# Patient Record
Sex: Female | Born: 1995 | Race: Black or African American | Hispanic: No | Marital: Single | Smoking: Current every day smoker
Health system: Southern US, Community
[De-identification: ages and names within clinical notes are randomized; demographics above are authoritative.]

## PROBLEM LIST (undated history)

## (undated) ENCOUNTER — Inpatient Hospital Stay (HOSPITAL_COMMUNITY): Payer: Self-pay

## (undated) DIAGNOSIS — Z789 Other specified health status: Secondary | ICD-10-CM

## (undated) HISTORY — PX: NO PAST SURGERIES: SHX2092

---

## 2013-09-21 ENCOUNTER — Emergency Department (HOSPITAL_COMMUNITY)
Admission: EM | Admit: 2013-09-21 | Discharge: 2013-09-21 | Disposition: A | Discharge status: Home / Self Care | Payer: Medicaid Other | Attending: Emergency Medicine | Admitting: Emergency Medicine

## 2013-09-21 ENCOUNTER — Encounter (HOSPITAL_COMMUNITY): Payer: Self-pay | Admitting: Emergency Medicine

## 2013-09-21 DIAGNOSIS — N898 Other specified noninflammatory disorders of vagina: Secondary | ICD-10-CM | POA: Insufficient documentation

## 2013-09-21 DIAGNOSIS — N939 Abnormal uterine and vaginal bleeding, unspecified: Secondary | ICD-10-CM

## 2013-09-21 DIAGNOSIS — Z3202 Encounter for pregnancy test, result negative: Secondary | ICD-10-CM | POA: Insufficient documentation

## 2013-09-21 DIAGNOSIS — Z880 Allergy status to penicillin: Secondary | ICD-10-CM | POA: Insufficient documentation

## 2013-09-21 DIAGNOSIS — N390 Urinary tract infection, site not specified: Secondary | ICD-10-CM | POA: Insufficient documentation

## 2013-09-21 LAB — HIV ANTIBODY (ROUTINE TESTING W REFLEX): HIV 1&2 Ab, 4th Generation: NONREACTIVE

## 2013-09-21 LAB — WET PREP, GENITAL
Clue Cells Wet Prep HPF POC: NONE SEEN
Trich, Wet Prep: NONE SEEN
Yeast Wet Prep HPF POC: NONE SEEN

## 2013-09-21 LAB — URINE MICROSCOPIC-ADD ON

## 2013-09-21 LAB — URINALYSIS, ROUTINE W REFLEX MICROSCOPIC
Bilirubin Urine: NEGATIVE
Glucose, UA: NEGATIVE mg/dL
Ketones, ur: NEGATIVE mg/dL
Nitrite: NEGATIVE
Protein, ur: 30 mg/dL — AB
Specific Gravity, Urine: 1.021 (ref 1.005–1.030)
Urobilinogen, UA: 1 mg/dL (ref 0.0–1.0)
pH: 7 (ref 5.0–8.0)

## 2013-09-21 LAB — POC URINE PREG, ED: Preg Test, Ur: NEGATIVE

## 2013-09-21 MED ORDER — CEPHALEXIN 500 MG PO CAPS: ORAL_CAPSULE | ORAL | Status: DC

## 2013-09-21 NOTE — ED Notes (Signed)
Pelvic cart at bedside. 

## 2013-09-21 NOTE — Discharge Instructions (Signed)
Urinary Tract Infection Urinary tract infections (UTIs) can develop anywhere along your urinary tract. Your urinary tract is your body's drainage system for removing wastes and extra water. Your urinary tract includes two kidneys, two ureters, a bladder, and a urethra. Your kidneys are a pair of bean-shaped organs. Each kidney is about the size of your fist. They are located below your ribs, one on each side of your spine. CAUSES Infections are caused by microbes, which are microscopic organisms, including fungi, viruses, and bacteria. These organisms are so small that they can only be seen through a microscope. Bacteria are the microbes that most commonly cause UTIs. SYMPTOMS  Symptoms of UTIs may vary by age and gender of the patient and by the location of the infection. Symptoms in young women typically include a frequent and intense urge to urinate and a painful, burning feeling in the bladder or urethra during urination. Older women and men are more likely to be tired, shaky, and weak and have muscle aches and abdominal pain. A fever may mean the infection is in your kidneys. Other symptoms of a kidney infection include pain in your back or sides below the ribs, nausea, and vomiting. DIAGNOSIS To diagnose a UTI, your caregiver will ask you about your symptoms. Your caregiver also will ask to provide a urine sample. The urine sample will be tested for bacteria and white blood cells. White blood cells are made by your body to help fight infection. TREATMENT  Typically, UTIs can be treated with medication. Because most UTIs are caused by a bacterial infection, they usually can be treated with the use of antibiotics. The choice of antibiotic and length of treatment depend on your symptoms and the type of bacteria causing your infection. HOME CARE INSTRUCTIONS  If you were prescribed antibiotics, take them exactly as your caregiver instructs you. Finish the medication even if you feel better after you  have only taken some of the medication.  Drink enough water and fluids to keep your urine clear or pale yellow.  Avoid caffeine, tea, and carbonated beverages. They tend to irritate your bladder.  Empty your bladder often. Avoid holding urine for long periods of time.  Empty your bladder before and after sexual intercourse.  After a bowel movement, women should cleanse from front to back. Use each tissue only once. SEEK MEDICAL CARE IF:   You have back pain.  You develop a fever.  Your symptoms do not begin to resolve within 3 days. SEEK IMMEDIATE MEDICAL CARE IF:   You have severe back pain or lower abdominal pain.  You develop chills.  You have nausea or vomiting.  You have continued burning or discomfort with urination. MAKE SURE YOU:   Understand these instructions.  Will watch your condition.  Will get help right away if you are not doing well or get worse. Document Released: 02/21/2005 Document Revised: 11/13/2011 Document Reviewed: 06/22/2011 [facility]Care Patient Information 2014 [place], [place].  Dysfunctional Uterine Bleeding Normally, menstrual periods begin between ages 3211 to 1617 in young women. A normal menstrual cycle/period may begin every 23 days up to 35 days and lasts from 1 to 7 days. Around 12 to 14 days before your menstrual period starts, ovulation (ovary produces an egg) occurs. When counting the time between menstrual periods, count from the first day of bleeding of the previous period to the first day of bleeding of the next period. Dysfunctional (abnormal) uterine bleeding is bleeding that is different from a normal menstrual period. Your periods  may come earlier or later than usual. They may be lighter, have blood clots or be heavier. You may have bleeding between periods, or you may skip one period or more. You may have bleeding after sexual intercourse, bleeding after menopause, or no menstrual period. CAUSES   Pregnancy (normal, miscarriage,  tubal).  IUDs (intrauterine device, birth control).  Birth control pills.  Hormone treatment.  Menopause.  Infection of the cervix.  Blood clotting problems.  Infection of the inside lining of the uterus.  Endometriosis, inside lining of the uterus growing in the pelvis and other female organs.  Adhesions (scar tissue) inside the uterus.  Obesity or severe weight loss.  Uterine polyps inside the uterus.  Cancer of the vagina, cervix, or uterus.  Ovarian cysts or polycystic ovary syndrome.  Medical problems (diabetes, thyroid disease).  Uterine fibroids (noncancerous tumor).  Problems with your female hormones.  Endometrial hyperplasia, very thick lining and enlarged cells inside of the uterus.  Medicines that interfere with ovulation.  Radiation to the pelvis or abdomen.  Chemotherapy. DIAGNOSIS   Your doctor will discuss the history of your menstrual periods, medicines you are taking, changes in your weight, stress in your life, and any medical problems you may have.  Your doctor will do a physical and pelvic examination.  Your doctor may want to perform certain tests to make a diagnosis, such as:  Pap test.  Blood tests.  Cultures for infection.  CT scan.  Ultrasound.  Hysteroscopy.  Laparoscopy.  MRI.  Hysterosalpingography.  D and C.  Endometrial biopsy. TREATMENT  Treatment will depend on the cause of the dysfunctional uterine bleeding (DUB). Treatment may include:  Observing your menstrual periods for a couple of months.  Prescribing medicines for medical problems, including:  Antibiotics.  Hormones.  Birth control pills.  Removing an IUD (intrauterine device, birth control).  Surgery:  D and C (scrape and remove tissue from inside the uterus).  Laparoscopy (examine inside the abdomen with a lighted tube).  Uterine ablation (destroy lining of the uterus with electrical current, laser, heat, or freezing).  Hysteroscopy  (examine cervix and uterus with a lighted tube).  Hysterectomy (remove the uterus). HOME CARE INSTRUCTIONS   If medicines were prescribed, take exactly as directed. Do not change or switch medicines without consulting your caregiver.  Long term heavy bleeding may result in iron deficiency. Your caregiver may have prescribed iron pills. They help replace the iron that your body lost from heavy bleeding. Take exactly as directed.  Do not take aspirin or medicines that contain aspirin one week before or during your menstrual period. Aspirin may make the bleeding worse.  If you need to change your sanitary pad or tampon more than once every 2 hours, stay in bed with your feet elevated and a cold pack on your lower abdomen. Rest as much as possible, until the bleeding stops or slows down.  Eat well-balanced meals. Eat foods high in iron. Examples are:  Leafy green vegetables.  Whole-grain breads and cereals.  Eggs.  Meat.  Liver.  Do not try to lose weight until the abnormal bleeding has stopped and your blood iron level is back to normal. Do not lift more than ten pounds or do strenuous activities when you are bleeding.  For a couple of months, make note on your calendar, marking the start and ending of your period, and the type of bleeding (light, medium, heavy, spotting, clots or missed periods). This is for your caregiver to better evaluate your  problem. SEEK MEDICAL CARE IF:   You develop nausea (feeling sick to your stomach) and vomiting, dizziness, or diarrhea while you are taking your medicine.  You are getting lightheaded or weak.  You have any problems that may be related to the medicine you are taking.  You develop pain with your DUB.  You want to remove your IUD.  You want to stop or change your birth control pills or hormones.  You have any type of abnormal bleeding mentioned above.  You are over 20 years old and have not had a menstrual period yet.  You are 18  years old and you are still having menstrual periods.  You have any of the symptoms mentioned above.  You develop a rash. SEEK IMMEDIATE MEDICAL CARE IF:   An oral temperature above 102 F (38.9 C) develops.  You develop chills.  You are changing your sanitary pad or tampon more than once an hour.  You develop abdominal pain.  You pass out or faint. Document Released: 05/11/2000 Document Revised: 08/06/2011 Document Reviewed: 04/12/2009 [facility]Exit[facility] Patient Information 2014 [place]ExitCare, [place]LL[place].

## 2013-09-21 NOTE — ED Notes (Signed)
Pt presents with abnormal vaginal bleeding and odor when urinating starting today. Pt LNM April 6

## 2013-09-21 NOTE — ED Provider Notes (Signed)
CSN: [id number]     Arrival date & time 09/21/13  1040 History   First MD Initiated Contact with Patient 09/21/13 1135     Chief Complaint  Patient presents with  . Vaginal Bleeding     (Consider location/radiation/quality/duration/timing/severity/associated sxs/prior Treatment) HPI Comments: Patient is an otherwise healthy 18 year old female who presents today for one episode of a light vaginal bleeding. She has not passed any blood clots. LMP 08/31/13. Generally her periods are regular. She is not on any form of birth control She has associated pain with urination and malodorous urine. All of her symptoms began this morning. She denies any vaginal discharge. No fever, chills, nausea, vomiting, abdominal pain, back pain.  The history is provided by the patient. No language interpreter was used.    History reviewed. No pertinent past medical history. History reviewed. No pertinent past surgical history. History reviewed. No pertinent family history. History  Substance Use Topics  . Smoking status: Never Smoker   . Smokeless tobacco: Not on file  . Alcohol Use: No   OB History   Grav Para Term Preterm Abortions TAB SAB Ect Mult Living                 Review of Systems  Constitutional: Negative for fever and chills.  Respiratory: Negative for shortness of breath.   Cardiovascular: Negative for chest pain.  Gastrointestinal: Negative for nausea, vomiting and abdominal pain.  Genitourinary: Positive for dysuria and vaginal bleeding. Negative for vaginal discharge.  Musculoskeletal: Negative for myalgias.  All other systems reviewed and are negative.     Allergies  Amoxicillin; Kiwi extract; and Penicillins  Home Medications   Prior to Admission medications   Not on File   BP 114/59  Pulse 70  Temp(Src) 98 F (36.7 C) (Oral)  Resp 16  Ht 5\' 7"  (1.702 m)  Wt 146 lb (66.225 kg)  BMI 22.86 kg/m2  SpO2 100%  LMP 08/31/2013 Physical Exam  Nursing note and vitals  reviewed. Constitutional: She is oriented to person, place, and time. She appears well-developed and well-nourished. She does not appear ill. No distress.  Patient is very well-appearing  HENT:  Head: Normocephalic and atraumatic.  Right Ear: External ear normal.  Left Ear: External ear normal.  Nose: Nose normal.  Mouth/Throat: Oropharynx is clear and moist.  Eyes: Conjunctivae are normal.  Neck: Normal range of motion.  Cardiovascular: Normal rate, regular rhythm and normal heart sounds.   Pulmonary/Chest: Effort normal and breath sounds normal. No stridor. No respiratory distress. She has no wheezes. She has no rales.  Abdominal: Soft. She exhibits no distension. There is no tenderness. There is no CVA tenderness.  Genitourinary: There is no rash, tenderness, lesion or injury on the right labia. There is no rash, tenderness, lesion or injury on the left labia. Cervix exhibits no motion tenderness, no discharge and no friability. Right adnexum displays no mass, no tenderness and no fullness. Left adnexum displays no mass, no tenderness and no fullness. No erythema, tenderness or bleeding around the vagina. No foreign body around the vagina. No signs of injury around the vagina. No vaginal discharge found.  No vaginal discharge, vaginal bleeding, or tenderness on pelvic exam.   Musculoskeletal: Normal range of motion.  Neurological: She is alert and oriented to person, place, and time. She has normal strength.  Skin: Skin is warm and dry. She is not diaphoretic. No erythema.  Psychiatric: She has a normal mood and affect. Her behavior is normal.  ED Course  Procedures (including critical care time) Labs Review Labs Reviewed  WET PREP, GENITAL - Abnormal; Notable for the following:    WBC, Wet Prep HPF POC FEW (*)    All other components within normal limits  URINALYSIS, ROUTINE W REFLEX MICROSCOPIC - Abnormal; Notable for the following:    APPearance CLOUDY (*)    Hgb urine dipstick  LARGE (*)    Protein, ur 30 (*)    Leukocytes, UA MODERATE (*)    All other components within normal limits  URINE MICROSCOPIC-ADD ON - Abnormal; Notable for the following:    Bacteria, UA FEW (*)    All other components within normal limits  GC/CHLAMYDIA PROBE AMP  HIV ANTIBODY (ROUTINE TESTING)  POC URINE PREG, ED    Imaging Review No results found.   EKG Interpretation None      MDM   Final diagnoses:  UTI (lower urinary tract infection)  Vaginal bleeding   Pt has been diagnosed with a UTI. Pt is afebrile, no CVA tenderness, normotensive, and denies N/V. Pt to be dc home with antibiotics and instructions to follow up with PCP if symptoms persist. No vaginal bleeding seen on pelvic exam.     Mora BellmanHannah S Terence Googe, PA-C 09/21/13 1552

## 2013-09-21 NOTE — ED Notes (Signed)
Pt c/o vaginal spotting, reports she already had her menstrual period this month and is wondering why she is spotting. Started this morning. Denies vaginal d/c, rash, bumps. Denies abd pain/n/v/d. Nad, skin warm and dry, resp e/u.

## 2013-09-22 LAB — GC/CHLAMYDIA PROBE AMP
CT Probe RNA: POSITIVE — AB
GC Probe RNA: NEGATIVE

## 2013-09-23 NOTE — ED Provider Notes (Signed)
Medical screening examination/treatment/procedure(s) were performed by non-physician practitioner and as supervising physician I was immediately available for consultation/collaboration.   EKG Interpretation None        Amari Zagal M Sevastian Witczak, DO 09/23/13 1627 

## 2013-09-24 ENCOUNTER — Telehealth (HOSPITAL_BASED_OUTPATIENT_CLINIC_OR_DEPARTMENT_OTHER): Payer: Self-pay | Admitting: Emergency Medicine

## 2013-09-24 NOTE — Telephone Encounter (Signed)
+  Chlamydia. Chart sent to EDP office for review. DHHS attached. 

## 2014-03-25 ENCOUNTER — Encounter (HOSPITAL_COMMUNITY): Payer: Self-pay | Admitting: Emergency Medicine

## 2014-03-25 ENCOUNTER — Emergency Department (HOSPITAL_COMMUNITY)
Admission: EM | Admit: 2014-03-25 | Discharge: 2014-03-25 | Disposition: A | Discharge status: Home / Self Care | Payer: Managed Care, Other (non HMO) | Source: Home / Self Care | Attending: Family Medicine | Admitting: Family Medicine

## 2014-03-25 DIAGNOSIS — N926 Irregular menstruation, unspecified: Secondary | ICD-10-CM

## 2014-03-25 DIAGNOSIS — R110 Nausea: Secondary | ICD-10-CM

## 2014-03-25 LAB — POCT URINALYSIS DIP (DEVICE)
Bilirubin Urine: NEGATIVE
Glucose, UA: NEGATIVE mg/dL
Ketones, ur: NEGATIVE mg/dL
Leukocytes, UA: NEGATIVE
Nitrite: NEGATIVE
Protein, ur: NEGATIVE mg/dL
Specific Gravity, Urine: 1.025 (ref 1.005–1.030)
Urobilinogen, UA: 1 mg/dL (ref 0.0–1.0)
pH: 6 (ref 5.0–8.0)

## 2014-03-25 LAB — POCT PREGNANCY, URINE: Preg Test, Ur: NEGATIVE

## 2014-03-25 NOTE — Discharge Instructions (Signed)
Thank you for coming in today. If your belly pain worsens, or you have high fever, bad vomiting, blood in your stool or black tarry stool go to the Emergency Room.   Viral Gastroenteritis Viral gastroenteritis is also known as stomach flu. This condition affects the stomach and intestinal tract. It can cause sudden diarrhea and vomiting. The illness typically lasts 3 to 8 days. Most people develop an immune response that eventually gets rid of the virus. While this natural response develops, the virus can make you quite ill. CAUSES  Many different viruses can cause gastroenteritis, such as rotavirus or noroviruses. You can catch one of these viruses by consuming contaminated food or water. You may also catch a virus by sharing utensils or other personal items with an infected person or by touching a contaminated surface. SYMPTOMS  The most common symptoms are diarrhea and vomiting. These problems can cause a severe loss of body fluids (dehydration) and a body salt (electrolyte) imbalance. Other symptoms may include:  Fever.  Headache.  Fatigue.  Abdominal pain. DIAGNOSIS  Your caregiver can usually diagnose viral gastroenteritis based on your symptoms and a physical exam. A stool sample may also be taken to test for the presence of viruses or other infections. TREATMENT  This illness typically goes away on its own. Treatments are aimed at rehydration. The most serious cases of viral gastroenteritis involve vomiting so severely that you are not able to keep fluids down. In these cases, fluids must be given through an intravenous line (IV). HOME CARE INSTRUCTIONS   Drink enough fluids to keep your urine clear or pale yellow. Drink small amounts of fluids frequently and increase the amounts as tolerated.  Ask your caregiver for specific rehydration instructions.  Avoid:  Foods high in sugar.  Alcohol.  Carbonated drinks.  Tobacco.  Juice.  Caffeine drinks.  Extremely hot or cold  fluids.  Fatty, greasy foods.  Too much intake of anything at one time.  Dairy products until 24 to 48 hours after diarrhea stops.  You may consume probiotics. Probiotics are active cultures of beneficial bacteria. They may lessen the amount and number of diarrheal stools in adults. Probiotics can be found in yogurt with active cultures and in supplements.  Wash your hands well to avoid spreading the virus.  Only take over-the-counter or prescription medicines for pain, discomfort, or fever as directed by your caregiver. Do not give aspirin to children. Antidiarrheal medicines are not recommended.  Ask your caregiver if you should continue to take your regular prescribed and over-the-counter medicines.  Keep all follow-up appointments as directed by your caregiver. SEEK IMMEDIATE MEDICAL CARE IF:   You are unable to keep fluids down.  You do not urinate at least once every 6 to 8 hours.  You develop shortness of breath.  You notice blood in your stool or vomit. This may look like coffee grounds.  You have abdominal pain that increases or is concentrated in one small area (localized).  You have persistent vomiting or diarrhea.  You have a fever.  The patient is a child younger than 3 months, and he or she has a fever.  The patient is a child older than 3 months, and he or she has a fever and persistent symptoms.  The patient is a child older than 3 months, and he or she has a fever and symptoms suddenly get worse.  The patient is a baby, and he or she has no tears when crying. MAKE SURE YOU:     Understand these instructions.  Will watch your condition.  Will get help right away if you are not doing well or get worse. Document Released: 05/14/2005 Document Revised: 08/06/2011 Document Reviewed: 02/28/2011 [employer] Patient Information 2015 [employer]. This information is not intended to replace advice given to you by your health care provider. Make sure you discuss  any questions you have with your health care provider.  

## 2014-03-25 NOTE — ED Provider Notes (Signed)
Kristina Holt is a 18 y.o. Holt who presents to Urgent Care today for irregular menstruation. Patient had a normal menstrual period on October 3. She developed spotting on October 27 and is having a full menstrual period now. She is currently taking an oral birth control pill. She denies any abdominal pain. She feels well otherwise. No fevers or chills vomiting or diarrhea. She has minimal nausea. Patient notes that she missed several doses of her birth control pill last week.   History reviewed. No pertinent past medical history. History  Substance Use Topics  . Smoking status: Never Smoker   . Smokeless tobacco: Not on file  . Alcohol Use: No   ROS as above Medications: No current facility-administered medications for this encounter.   Current Outpatient Prescriptions  Medication Sig Dispense Refill  . cephALEXin (KEFLEX) 500 MG capsule 2 caps po bid x 7 days  28 capsule  0    Exam:  BP 112/74  Pulse 59  Temp(Src) 97.9 F (36.6 C) (Oral)  Resp 16  Ht 5\' 8"  (1.727 m)  Wt 158 lb (71.668 kg)  BMI 24.03 kg/m2  SpO2 100%  LMP 02/27/2014 Gen: Well NAD HEENT: EOMI,  MMM Lungs: Normal work of breathing. CTABL Heart: RRR no MRG Abd: NABS, Soft. Nondistended, Nontender no CVA angle tenderness to percussion Exts: Brisk capillary refill, warm and well perfused.   Results for orders placed during the hospital encounter of 03/25/14 (from the past 24 hour(s))  POCT URINALYSIS DIP (DEVICE)     Status: Abnormal   Collection Time    03/25/14  2:07 PM      Result Value Ref Range   Glucose, UA NEGATIVE  NEGATIVE mg/dL   Bilirubin Urine NEGATIVE  NEGATIVE   Ketones, ur NEGATIVE  NEGATIVE mg/dL   Specific Gravity, Urine 1.025  1.005 - 1.030   Hgb urine dipstick MODERATE (*) NEGATIVE   pH 6.0  5.0 - 8.0   Protein, ur NEGATIVE  NEGATIVE mg/dL   Urobilinogen, UA 1.0  0.0 - 1.0 mg/dL   Nitrite NEGATIVE  NEGATIVE   Leukocytes, UA NEGATIVE  NEGATIVE  POCT PREGNANCY, URINE     Status:  None   Collection Time    03/25/14  2:07 PM      Result Value Ref Range   Preg Test, Ur NEGATIVE  NEGATIVE   No results found.  Assessment and Plan: 18 y.o. Holt with Mr. irregularity likely due to inconsistent use with birth control pills. Follow-up as needed.  Discussed warning signs or symptoms. Please see discharge instructions. Patient expresses understanding.     Rodolph BongEvan S Darroll Bredeson, MD 03/25/14 [phone]

## 2014-03-25 NOTE — ED Notes (Signed)
Pt states that her menstrual was 02/27/2014 pt states that she started spotting 03/23/2014 and 03/24/2014 and today she has a full menstrual. Pt is in no acute distress at this time.

## 2016-01-06 ENCOUNTER — Emergency Department (HOSPITAL_COMMUNITY)
Admission: EM | Admit: 2016-01-06 | Discharge: 2016-01-06 | Disposition: A | Discharge status: Home / Self Care | Payer: BLUE CROSS/BLUE SHIELD | Attending: Emergency Medicine | Admitting: Emergency Medicine

## 2016-01-06 ENCOUNTER — Encounter (HOSPITAL_COMMUNITY): Payer: Self-pay | Admitting: Emergency Medicine

## 2016-01-06 ENCOUNTER — Emergency Department (HOSPITAL_COMMUNITY): Payer: BLUE CROSS/BLUE SHIELD

## 2016-01-06 DIAGNOSIS — Z3A01 Less than 8 weeks gestation of pregnancy: Secondary | ICD-10-CM | POA: Diagnosis not present

## 2016-01-06 DIAGNOSIS — Z3490 Encounter for supervision of normal pregnancy, unspecified, unspecified trimester: Secondary | ICD-10-CM

## 2016-01-06 DIAGNOSIS — R102 Pelvic and perineal pain: Secondary | ICD-10-CM | POA: Diagnosis not present

## 2016-01-06 DIAGNOSIS — R1031 Right lower quadrant pain: Secondary | ICD-10-CM | POA: Diagnosis not present

## 2016-01-06 DIAGNOSIS — O26891 Other specified pregnancy related conditions, first trimester: Secondary | ICD-10-CM | POA: Insufficient documentation

## 2016-01-06 DIAGNOSIS — Z79899 Other long term (current) drug therapy: Secondary | ICD-10-CM | POA: Insufficient documentation

## 2016-01-06 DIAGNOSIS — R109 Unspecified abdominal pain: Secondary | ICD-10-CM

## 2016-01-06 LAB — URINALYSIS, ROUTINE W REFLEX MICROSCOPIC
Bilirubin Urine: NEGATIVE
Glucose, UA: NEGATIVE mg/dL
Ketones, ur: NEGATIVE mg/dL
Nitrite: POSITIVE — AB
Protein, ur: 30 mg/dL — AB
Specific Gravity, Urine: 1.018 (ref 1.005–1.030)
pH: 6 (ref 5.0–8.0)

## 2016-01-06 LAB — POC URINE PREG, ED: Preg Test, Ur: POSITIVE — AB

## 2016-01-06 LAB — URINE MICROSCOPIC-ADD ON

## 2016-01-06 LAB — HCG, QUANTITATIVE, PREGNANCY: hCG, Beta Chain, Quant, S: 18442 m[IU]/mL — ABNORMAL HIGH (ref <5)

## 2016-01-06 NOTE — ED Provider Notes (Signed)
[facility]-EMERGENCY DEPT Provider Note   CSN: [id number] Arrival date & time: 01/06/16  1144  First Provider Contact:  First MD Initiated Contact with Patient 01/06/16 1212        History   Chief Complaint Chief Complaint  Patient presents with  . Abdominal Pain  . Dysuria  . Late Period    HPI Kristina Holt is a 20 y.o. female.  HPI  Patient presents to the emergency department with lower abdominal pain and states that she started having some dysuria and states that there is pain with urination.  The patient states she did not take any medications prior to arrival.  Nothing seems make the condition better or worseThe patient denies chest pain, shortness of breath, headache,blurred vision, neck pain, fever, cough, weakness, numbness, dizziness, anorexia, edema, no vaginal discharge, vaginal bleeding nausea, vomiting, diarrhea, rash, back pain, dysuria, hematemesis, bloody stool, near syncope, or syncope. History reviewed. No pertinent past medical history.  There are no active problems to display for this patient.   History reviewed. No pertinent surgical history.  OB History    No data available       Home Medications    Prior to Admission medications   Medication Sig Start Date End Date Taking? Authorizing Provider  Bacillus Coagulans-Inulin (PROBIOTIC-PREBIOTIC PO) Take 2 capsules by mouth daily.   Yes Historical Provider, MD    Family History No family history on file.  Social History Social History  Substance Use Topics  . Smoking status: Never Smoker  . Smokeless tobacco: Never Used  . Alcohol use No     Allergies   Amoxicillin; Kiwi extract; and Penicillins   Review of Systems Review of Systems  All other systems negative except as documented in the HPI. All pertinent positives and negatives as reviewed in the HPI.  Physical Exam Updated Vital Signs BP 112/73 (BP Location: Left Arm)   Pulse 72   Temp 99.3 F (37.4 C) (Oral)   Resp 15   Ht  5\' 8"  (1.727 m)   Wt 79.4 kg   LMP 11/26/2015   SpO2 100%   BMI 26.61 kg/m   Physical Exam  Constitutional: She is oriented to person, place, and time. She appears well-developed and well-nourished. No distress.  HENT:  Head: Normocephalic and atraumatic.  Mouth/Throat: Oropharynx is clear and moist.  Eyes: Pupils are equal, round, and reactive to light.  Neck: Normal range of motion. Neck supple.  Cardiovascular: Normal rate, regular rhythm and normal heart sounds.  Exam reveals no gallop and no friction rub.   No murmur heard. Pulmonary/Chest: Effort normal and breath sounds normal. No respiratory distress. She has no wheezes.  Abdominal: Soft. Bowel sounds are normal. She exhibits no distension. There is no tenderness.  Neurological: She is alert and oriented to person, place, and time. She exhibits normal muscle tone. Coordination normal.  Skin: Skin is warm and dry. No rash noted. No erythema.  Psychiatric: She has a normal mood and affect. Her behavior is normal.  Nursing note and vitals reviewed.    ED Treatments / Results  Labs (all labs ordered are listed, but only abnormal results are displayed) Labs Reviewed  URINALYSIS, ROUTINE W REFLEX MICROSCOPIC (NOT [facility]) - Abnormal; Notable for the following:       Result Value   APPearance CLOUDY (*)    Hgb urine dipstick MODERATE (*)    Protein, ur 30 (*)    Nitrite POSITIVE (*)    Leukocytes, UA LARGE (*)  All other components within normal limits  URINE MICROSCOPIC-ADD ON - Abnormal; Notable for the following:    Squamous Epithelial / LPF 0-5 (*)    Bacteria, UA MANY (*)    All other components within normal limits  POC URINE PREG, ED - Abnormal; Notable for the following:    Preg Test, Ur POSITIVE (*)    All other components within normal limits  HCG, QUANTITATIVE, PREGNANCY    EKG  EKG Interpretation None       Radiology US Ob Comp Less 14 Wks  Result Date: 01/06/2016 CLINICAL DATA:  Right pelvic  pain for 5 days. Quantitative beta HCG is pending. LMP was07/11/2015. Gestational age by LMP is5 weeks 0 days. EDC by LMP is04/13/2018. EXAM: OBSTETRIC <14 WK [place] AND TRANSVAGINAL OB US TECHNIQUE: Both transabdominal and transvaginal ultrasound examinations were performed for complete evaluation of the gestation as well as the maternal uterus, adnexal regions, and pelvic cul-de-sac. Transvaginal technique was performed to assess early pregnancy. COMPARISON:  None. FINDINGS: Intrauterine gestational sac: Present Yolk sac:  Present Embryo:  Present Cardiac Activity: Not seen Heart Rate: Absent  bpm CRL:  2.6  mm   5 w   6 d                  [place] EDC: 09/01/2016 Subchorionic hemorrhage:  None visualized. Maternal uterus/adnexae: The ovaries have a normal appearance. No free pelvic fluid. IMPRESSION: 1. Intrauterine embryo identified. Cardiac activity is not yet present. Followup ultrasound is suggested in 10-14 days to document presence of cardiac activity and to confirm dating. 2. Dating by today's exam differs from clinical dating. 3. Ultrasound EDC is 09/01/2016. Electronically Signed   By: Norva Pavlov M.D.   On: 01/06/2016 14:59   US Ob Transvaginal  Result Date: 01/06/2016 CLINICAL DATA:  Right pelvic pain for 5 days. Quantitative beta HCG is pending. LMP was07/11/2015. Gestational age by LMP is5 weeks 0 days. EDC by LMP is04/13/2018. EXAM: OBSTETRIC <14 WK [place] AND TRANSVAGINAL OB US TECHNIQUE: Both transabdominal and transvaginal ultrasound examinations were performed for complete evaluation of the gestation as well as the maternal uterus, adnexal regions, and pelvic cul-de-sac. Transvaginal technique was performed to assess early pregnancy. COMPARISON:  None. FINDINGS: Intrauterine gestational sac: Present Yolk sac:  Present Embryo:  Present Cardiac Activity: Not seen Heart Rate: Absent  bpm CRL:  2.6  mm   5 w   6 d                  [place] EDC: 09/01/2016 Subchorionic hemorrhage:  None visualized. Maternal  uterus/adnexae: The ovaries have a normal appearance. No free pelvic fluid. IMPRESSION: 1. Intrauterine embryo identified. Cardiac activity is not yet present. Followup ultrasound is suggested in 10-14 days to document presence of cardiac activity and to confirm dating. 2. Dating by today's exam differs from clinical dating. 3. Ultrasound EDC is 09/01/2016. Electronically Signed   By: Norva Pavlov M.D.   On: 01/06/2016 14:59    Procedures Procedures (including critical care time)  Medications Ordered in ED Medications - No data to display   Initial Impression / Assessment and Plan / ED Course  I have reviewed the triage vital signs and the nursing notes.  Pertinent labs & imaging results that were available during my care of the patient were reviewed by me and considered in my medical decision making (see chart for details).  Clinical Course  Patient be referred to [facility] hospital clinic.  Told to return here  as needed.  Patient agrees the plan and all questions were answered.  I did advise this could be an early threatened abortion and that she would need to get an MAU for any worsening in her condition   Final Clinical Impressions(s) / ED Diagnoses   Final diagnoses:  Acute abdominal pain  Acute abdominal pain    New Prescriptions New Prescriptions   No medications on file     Charlestine Night, PA-C 01/06/16 1559    [name], MD 01/07/16 [phone]

## 2016-01-06 NOTE — ED Notes (Signed)
US at bedside

## 2016-01-06 NOTE — Discharge Instructions (Signed)
Return here as needed.  Follow-up with the clinic provided.  °

## 2016-01-06 NOTE — ED Triage Notes (Signed)
Patient c/o RLQ/lower abd/pelvic pain that is intermittent since Monday.  Patient also c/o dysuria since Monday as well.  Patient denies blood in urine but states that urine is darker in color than normal.  Patient also hasnt had her cycle for this month yet and states that she is regular at beginning of each month.

## 2016-01-15 ENCOUNTER — Emergency Department (HOSPITAL_COMMUNITY): Payer: BLUE CROSS/BLUE SHIELD

## 2016-01-15 ENCOUNTER — Emergency Department (HOSPITAL_COMMUNITY)
Admission: EM | Admit: 2016-01-15 | Discharge: 2016-01-15 | Disposition: A | Discharge status: Home / Self Care | Payer: BLUE CROSS/BLUE SHIELD | Attending: Emergency Medicine | Admitting: Emergency Medicine

## 2016-01-15 ENCOUNTER — Encounter (HOSPITAL_COMMUNITY): Payer: Self-pay | Admitting: Emergency Medicine

## 2016-01-15 DIAGNOSIS — O4690 Antepartum hemorrhage, unspecified, unspecified trimester: Secondary | ICD-10-CM

## 2016-01-15 DIAGNOSIS — O209 Hemorrhage in early pregnancy, unspecified: Secondary | ICD-10-CM | POA: Insufficient documentation

## 2016-01-15 DIAGNOSIS — O2341 Unspecified infection of urinary tract in pregnancy, first trimester: Secondary | ICD-10-CM | POA: Diagnosis not present

## 2016-01-15 DIAGNOSIS — R102 Pelvic and perineal pain: Secondary | ICD-10-CM | POA: Diagnosis not present

## 2016-01-15 DIAGNOSIS — Z5181 Encounter for therapeutic drug level monitoring: Secondary | ICD-10-CM | POA: Insufficient documentation

## 2016-01-15 DIAGNOSIS — Z3A01 Less than 8 weeks gestation of pregnancy: Secondary | ICD-10-CM | POA: Diagnosis not present

## 2016-01-15 DIAGNOSIS — N390 Urinary tract infection, site not specified: Secondary | ICD-10-CM

## 2016-01-15 DIAGNOSIS — Z3490 Encounter for supervision of normal pregnancy, unspecified, unspecified trimester: Secondary | ICD-10-CM

## 2016-01-15 LAB — URINALYSIS, ROUTINE W REFLEX MICROSCOPIC
Bilirubin Urine: NEGATIVE
Glucose, UA: NEGATIVE mg/dL
Ketones, ur: NEGATIVE mg/dL
Nitrite: POSITIVE — AB
Protein, ur: NEGATIVE mg/dL
Specific Gravity, Urine: 1.024 (ref 1.005–1.030)
pH: 6 (ref 5.0–8.0)

## 2016-01-15 LAB — WET PREP, GENITAL
Clue Cells Wet Prep HPF POC: NONE SEEN
Sperm: NONE SEEN
Trich, Wet Prep: NONE SEEN
Yeast Wet Prep HPF POC: NONE SEEN

## 2016-01-15 LAB — URINE MICROSCOPIC-ADD ON

## 2016-01-15 LAB — HCG, QUANTITATIVE, PREGNANCY: hCG, Beta Chain, Quant, S: 65491 m[IU]/mL — ABNORMAL HIGH (ref <5)

## 2016-01-15 LAB — I-STAT BETA HCG BLOOD, ED (MC, WL, AP ONLY): I-stat hCG, quantitative: >2000 m[IU]/mL — ABNORMAL HIGH (ref <5)

## 2016-01-15 LAB — ABO/RH: ABO/RH(D): O POS

## 2016-01-15 MED ORDER — ACETAMINOPHEN 325 MG PO TABS
325.0000 mg | ORAL_TABLET | Freq: Once | ORAL | Status: AC
  Administered 2016-01-15: 325 mg via ORAL
  Filled 2016-01-15: qty 1

## 2016-01-15 MED ORDER — DEXTROSE 5 % IV SOLN
1.0000 g | Freq: Once | INTRAVENOUS | Status: AC
  Administered 2016-01-15: 1 g via INTRAVENOUS
  Filled 2016-01-15: qty 10

## 2016-01-15 MED ORDER — CEPHALEXIN 500 MG PO CAPS: 500.0000 mg | ORAL_CAPSULE | Freq: Two times a day (BID) | ORAL | 0 refills | Status: DC

## 2016-01-15 NOTE — ED Provider Notes (Signed)
[facility] DE[facility] Provider Note   CSN: [id number] Arrival date & time: 01/15/16  1057     History   Chief Complaint Chief Complaint  Patient presents with  . Vaginal Bleeding    HPI Kristina Holt is a 20 y.o. female.  Kristina Holt is a 20 y.o. female G1P0 presents to ED with complaint of vaginal bleeding. Patient states yesterday she noted thick brown discharge concerned about vaginal bleeding, she denies any bright red or dark red clots. She has not seen any discharge today. She has associated vaginal pain, vaginal itching, and pain with urination. She also endorses chronic lower abdominal cramping, states no new changes in lower abdominal pain. No N/V, hematuria, chest pain, SOB, rashes. She does note a left sided headache 6/10, no numbness, weakness, trauma, changes in vision, or neck pain. She is sexually active with 3 female partners in the last six months, no OCP and no barrier protection. Patient states no hx of STIs; however, review of records show patient had chlamydia 2 years ago. Patient was seen on 01/06/16 in ED with lower abdominal pain, positive pregnancy test; US showed [redacted]w[redacted]d IUP, cardiac activity could not yet be visualized. Patient states she has established an OBGYN and is scheduled for repeat US on 01/25/16.        History reviewed. No pertinent past medical history.  There are no active problems to display for this patient.   History reviewed. No pertinent surgical history.  OB History    No data available       Home Medications    Prior to Admission medications   Medication Sig Start Date End Date Taking? Authorizing Provider  cephALEXin (KEFLEX) 500 MG capsule Take 1 capsule (500 mg total) by mouth 2 (two) times daily. 01/15/16   Lona Kettle, PA-C    Family History No family history on file.  Social History Social History  Substance Use Topics  . Smoking status: Never Smoker  . Smokeless tobacco: Never Used  . Alcohol use No      Allergies   Amoxicillin; Kiwi extract; and Penicillins   Review of Systems Review of Systems  Constitutional: Negative for chills, diaphoresis and fever.       States "feel hot," she is fanning herself  HENT: Negative for congestion and trouble swallowing.   Eyes: Negative for visual disturbance.  Respiratory: Negative for shortness of breath.   Cardiovascular: Negative for chest pain.  Gastrointestinal: Positive for abdominal pain ( chronic). Negative for blood in stool, constipation, diarrhea, nausea and vomiting.  Genitourinary: Positive for dysuria, vaginal bleeding ( "brown discharge"), vaginal discharge and vaginal pain. Negative for hematuria.  Musculoskeletal: Negative for neck pain.  Skin: Negative for rash.  Neurological: Positive for headaches. Negative for dizziness, syncope, weakness, light-headedness and numbness.     Physical Exam Updated Vital Signs BP 119/75 (BP Location: Left Arm)   Pulse 90   Temp 98.8 F (37.1 C) (Oral)   Resp 16   LMP 12/02/2015   SpO2 100%   Physical Exam  Constitutional: She appears well-developed and well-nourished. No distress.  HENT:  Head: Normocephalic and atraumatic.  Mouth/Throat: Oropharynx is clear and moist. No oropharyngeal exudate.  Eyes: Conjunctivae and EOM are normal. Pupils are equal, round, and reactive to light. Right eye exhibits no discharge. Left eye exhibits no discharge. No scleral icterus.  Neck: Normal range of motion. Neck supple.  Cardiovascular: Normal rate, regular rhythm, normal heart sounds and intact distal pulses.   No murmur heard.  Pulmonary/Chest: Effort normal and breath sounds normal. No respiratory distress. She has no wheezes. She has no rales.  Abdominal: Soft. Bowel sounds are normal. She exhibits no distension. There is no tenderness. There is no rebound and no guarding.  Genitourinary: Pelvic exam was performed with patient supine. There is no rash, tenderness, lesion or injury on the  right labia. There is no rash, tenderness, lesion or injury on the left labia. Cervix exhibits discharge. Cervix exhibits no motion tenderness and no friability. Right adnexum displays no mass and no tenderness. Left adnexum displays no mass and no tenderness. No erythema, tenderness or bleeding in the vagina. No foreign body in the vagina. No signs of injury around the vagina. Vaginal discharge found.  Genitourinary Comments: Chaperone present for duration of exam. External anatomy normal - no injury, lesions, masses, or rashes. No bleeding, lesions, masses, or ulcerations in vaginal cavity. Cervix is closed with green/brown discharge. No friability. No CMT, no adnexal tenderness, no masses palpated on bimanual exam.   Musculoskeletal: Normal range of motion.  Lymphadenopathy:    She has no cervical adenopathy.  Neurological: She is alert. She is not disoriented. Coordination normal. GCS eye subscore is 4. GCS verbal subscore is 5. GCS motor subscore is 6.  Mental Status:  Alert, thought content appropriate, able to give a coherent history. Speech fluent without evidence of aphasia. Able to follow 2 step commands without difficulty.  Cranial Nerves:  II:  Peripheral visual fields grossly normal, pupils equal, round, reactive to light III,IV, VI: ptosis not present, extra-ocular motions intact bilaterally  V,VII: smile symmetric, facial light touch sensation equal VIII: hearing grossly normal to voice  X: uvula elevates symmetrically  XI: bilateral shoulder shrug symmetric and strong XII: midline tongue extension without fassiculations Motor:  Normal tone. 5/5 in upper and lower extremities bilaterally including strong and equal grip strength and dorsiflexion/plantar flexion Sensory: light touch normal in all extremities. Cerebellar: normal finger-to-nose with bilateral upper extremities Gait: normal gait and balance CV: distal pulses palpable throughout   Skin: Skin is warm and dry. She is not  diaphoretic.  Psychiatric: She has a normal mood and affect. Her behavior is normal.     ED Treatments / Results  Labs (all labs ordered are listed, but only abnormal results are displayed) Labs Reviewed  WET PREP, GENITAL - Abnormal; Notable for the following:       Result Value   WBC, Wet Prep HPF POC MODERATE (*)    All other components within normal limits  URINALYSIS, ROUTINE W REFLEX MICROSCOPIC (NOT AT [facility]) - Abnormal; Notable for the following:    APPearance TURBID (*)    Hgb urine dipstick SMALL (*)    Nitrite POSITIVE (*)    Leukocytes, UA LARGE (*)    All other components within normal limits  HCG, QUANTITATIVE, PREGNANCY - Abnormal; Notable for the following:    hCG, Beta Chain, Quant, S 65,491 (*)    All other components within normal limits  URINE MICROSCOPIC-ADD ON - Abnormal; Notable for the following:    Squamous Epithelial / LPF 0-5 (*)    Bacteria, UA MANY (*)    All other components within normal limits  I-STAT BETA HCG BLOOD, ED ([facility], [facility], [facility] ONLY) - Abnormal; Notable for the following:    I-stat hCG, quantitative >2,000.0 (*)    All other components within normal limits  URINE CULTURE  HIV ANTIBODY (ROUTINE TESTING)  RPR  ABO/RH  GC/CHLAMYDIA PROBE AMP ([place]) NOT AT  [facility]    EKG  EKG Interpretation None       Radiology US Ob Comp Less 14 Wks  Result Date: 01/15/2016 CLINICAL DATA:  Pregnant patient with vaginal discharge and bleeding. EXAM: OBSTETRIC <14 WK [place] AND TRANSVAGINAL OB US TECHNIQUE: Both transabdominal and transvaginal ultrasound examinations were performed for complete evaluation of the gestation as well as the maternal uterus, adnexal regions, and pelvic cul-de-sac. Transvaginal technique was performed to assess early pregnancy. COMPARISON:  None. FINDINGS: Intrauterine gestational sac: A single gestational sac is identified. Yolk sac:  A yolk sac is identified. Embryo:  A single embryo is noted. Cardiac Activity: Present Heart  Rate: 144  bpm MSD:   mm    w     d CRL:  6.8  mm   6 w   4 d                  [place] [facility]: September 05, 2016 Subchorionic hemorrhage:  None Maternal uterus/adnexae: The ovaries are normal in appearance. IMPRESSION: Single live IUP with no cause for bleeding identified. Electronically Signed   By: Gerome Sam III M.D   On: 01/15/2016 16:11   US Ob Transvaginal  Result Date: 01/15/2016 CLINICAL DATA:  Pregnant patient with vaginal discharge and bleeding. EXAM: OBSTETRIC <14 WK [place] AND TRANSVAGINAL OB US TECHNIQUE: Both transabdominal and transvaginal ultrasound examinations were performed for complete evaluation of the gestation as well as the maternal uterus, adnexal regions, and pelvic cul-de-sac. Transvaginal technique was performed to assess early pregnancy. COMPARISON:  None. FINDINGS: Intrauterine gestational sac: A single gestational sac is identified. Yolk sac:  A yolk sac is identified. Embryo:  A single embryo is noted. Cardiac Activity: Present Heart Rate: 144  bpm MSD:   mm    w     d CRL:  6.8  mm   6 w   4 d                  [place] [facility]: September 05, 2016 Subchorionic hemorrhage:  None Maternal uterus/adnexae: The ovaries are normal in appearance. IMPRESSION: Single live IUP with no cause for bleeding identified. Electronically Signed   By: Gerome Sam III M.D   On: 01/15/2016 16:11    Procedures Procedures (including critical care time)  Medications Ordered in ED Medications  cefTRIAXone (ROCEPHIN) 1 g in dextrose 5 % 50 mL IVPB (1 g Intravenous New Bag/Given 01/15/16 1650)  acetaminophen (TYLENOL) tablet 325 mg (325 mg Oral Given 01/15/16 1653)     Initial Impression / Assessment and Plan / ED Course  I have reviewed the triage vital signs and the nursing notes.  Pertinent labs & imaging results that were available during my care of the patient were reviewed by me and considered in my medical decision making (see chart for details).  Clinical Course  Value Comment By Time  US Ob  Transvaginal Reviewed Lona Kettle, PA-C 08/20 1621    Patient presents to ED with concern for vaginal bleeding. Patient seen in ED 01/06/16 with positive pregnancy test, [place] at time showed [redacted]w[redacted]d IUP, cardiac activity not yet visualized. Today, patient is afebrile and non-toxic appearing in NAD. She is reclining comfortably in bed. Vital signs are stable. No focal neurologic deficits. No nuchal rigidity. No changes in vision. Pelvic exam remarkable for closed cervix with brown/green discharge. Will check hcg quant, UA, Rh, wet prep, gc/chlamydia. Will repeat OB US to assess for cardiac activity.   U/A remarkable for UTI,  will treat and culture. ABX given in ED. Tylenol for headache. Patient O+. Wet prep unremarkable. GC/chlamydia/HIV/RPR pending. hcg US remarkable for single IUP with cardiac activity, gestational age 1056w4days. Hcg quant agreeable with gestational age. With vaginal bleeding, concern for possible threatened abortion. Discussed results and plan with patient. Rx keflex. Patient is scheduled to see OBGYN at end of month, encouraged to keep appointment. Discussed return precautions to include increased vaginal bleeding, worsening lower abdominal pain, passing of large clots/tissue. Patient voiced understanding and is agreeable.   Final Clinical Impressions(s) / ED Diagnoses   Final diagnoses:  Pregnancy  Vaginal bleeding in pregnancy, first trimester  Urinary tract infection without hematuria, site unspecified    New Prescriptions New Prescriptions   CEPHALEXIN (KEFLEX) 500 MG CAPSULE    Take 1 capsule (500 mg total) by mouth 2 (two) times daily.     Lona Kettleshley Laurel Meyer, [place] JerseyPA-C 01/15/16 1657    Charlynne Panderavid Hsienta Yao, MD 01/15/16 [phone]

## 2016-01-15 NOTE — ED Notes (Signed)
Patient's mother called wanting to know patient's results. Spoke with patient, patient states she will speak to mother. Patient states ER staff does not need to call mother to give her results.

## 2016-01-15 NOTE — ED Notes (Signed)
PA at bedside.

## 2016-01-15 NOTE — ED Notes (Signed)
Discharge instructions, follow up care, and rx x1 reviewed with patient. Patient verbalized understanding. 

## 2016-01-15 NOTE — ED Notes (Signed)
[phone] Henrene HawkingBeth Murphy (mother)

## 2016-01-15 NOTE — ED Notes (Signed)
Patient transported to ultrasound.

## 2016-01-15 NOTE — ED Notes (Signed)
1 set of blood cultures collected prior to antibiotic administration.

## 2016-01-15 NOTE — ED Triage Notes (Signed)
Patient states that last night she had dark brown blood clots in her panties. Patient denies any pain or clots at this time but states that she has some abd cramping "but they told me that was part of this process".

## 2016-01-15 NOTE — ED Triage Notes (Signed)
Patient states that last cycle was 12/02/15, and hasnt had one this month yet.  Patient states that is sexually active and denies using any birth control.

## 2016-01-15 NOTE — Discharge Instructions (Signed)
Read the information below.   Your labs are re-assuring. You have a urinary tract infection. You are being treated for a UTI. Take antibiotics as prescribed. If you develop a rash or difficulty breathing, discontinue and return to the ED.  Your US showed an intrauterine pregnancy with heart activity. With bloody discharge, you are at risk of miscarriage. If you develop severe lower abdominal cramping, feel a gush of fluid, increased vaginal bleeding, or passing clots/tissue please [facility].  Use the prescribed medication as directed.   Please discuss all new medications with your pharmacist.   Be sure to keep your scheduled appointment with OBGYN.  You may return to the Emergency Department at any time for worsening condition or any new symptoms that concern you.

## 2016-01-15 NOTE — ED Notes (Signed)
Patient's brother arrived to ED. Patient left ED with brother.

## 2016-01-15 NOTE — ED Notes (Signed)
Pt mother called and wanted information about her daughters care [place] to ED. After talking with pt and obtaining verbal consent, I spoke with pts mother about pt results. Pt was able to hear the conversation and verbalized understanding of her care and results. Pt mother verbalized that she was appreciative of being informed. PA is aware of situation.

## 2016-01-16 LAB — HIV ANTIBODY (ROUTINE TESTING W REFLEX): HIV Screen 4th Generation wRfx: NONREACTIVE

## 2016-01-16 LAB — GC/CHLAMYDIA PROBE AMP (~~LOC~~) NOT AT ARMC
Chlamydia: NEGATIVE
Neisseria Gonorrhea: NEGATIVE

## 2016-01-16 LAB — RPR: RPR Ser Ql: NONREACTIVE

## 2016-01-17 LAB — URINE CULTURE: Culture: >=100000 — AB

## 2016-01-18 ENCOUNTER — Telehealth (HOSPITAL_BASED_OUTPATIENT_CLINIC_OR_DEPARTMENT_OTHER): Payer: Self-pay | Admitting: Emergency Medicine

## 2016-01-18 NOTE — Telephone Encounter (Signed)
Post ED Visit - Positive Culture Follow-up  Culture report reviewed by antimicrobial stewardship pharmacist:  []  Enzo BiNathan Batchelder, Pharm.D. []  Celedonio MiyamotoJeremy Frens, Pharm.D., BCPS []  Garvin FilaMike Maccia, Pharm.D. []  Georgina PillionElizabeth Martin, Pharm.D., BCPS []  LisbonMinh Pham, [address]Ph[address]D., BCPS, AAHIVP []  Estella HuskMichelle Turner, Pharm.D., BCPS, AAHIVP []  Tennis Mustassie Stewart, [address]Ph[address]D. []  Sherle Poeob Vincent, [address]Ph[address] Vianne BullsKai Kong RPh  Positive urine culture Treated with cephalexin, no further patient follow-up is required at this time.  Berle MullMiller, Martyna Thorns 01/18/2016, 10:53 AM

## 2016-01-25 ENCOUNTER — Encounter (HOSPITAL_COMMUNITY): Payer: Self-pay | Admitting: *Deleted

## 2016-01-25 ENCOUNTER — Inpatient Hospital Stay (HOSPITAL_COMMUNITY)
Admission: AD | Admit: 2016-01-25 | Discharge: 2016-01-25 | Disposition: A | Discharge status: Home / Self Care | Payer: BLUE CROSS/BLUE SHIELD | Source: Ambulatory Visit | Attending: Obstetrics and Gynecology | Admitting: Obstetrics and Gynecology

## 2016-01-25 DIAGNOSIS — O418X10 Other specified disorders of amniotic fluid and membranes, first trimester, not applicable or unspecified: Secondary | ICD-10-CM

## 2016-01-25 DIAGNOSIS — Z79899 Other long term (current) drug therapy: Secondary | ICD-10-CM | POA: Insufficient documentation

## 2016-01-25 DIAGNOSIS — N390 Urinary tract infection, site not specified: Secondary | ICD-10-CM

## 2016-01-25 DIAGNOSIS — N898 Other specified noninflammatory disorders of vagina: Secondary | ICD-10-CM | POA: Diagnosis present

## 2016-01-25 DIAGNOSIS — Z880 Allergy status to penicillin: Secondary | ICD-10-CM | POA: Diagnosis not present

## 2016-01-25 DIAGNOSIS — O468X1 Other antepartum hemorrhage, first trimester: Secondary | ICD-10-CM

## 2016-01-25 DIAGNOSIS — Z3A01 Less than 8 weeks gestation of pregnancy: Secondary | ICD-10-CM | POA: Diagnosis not present

## 2016-01-25 DIAGNOSIS — Z36 Encounter for antenatal screening of mother: Secondary | ICD-10-CM

## 2016-01-25 DIAGNOSIS — O208 Other hemorrhage in early pregnancy: Secondary | ICD-10-CM | POA: Insufficient documentation

## 2016-01-25 HISTORY — DX: Other specified health status: Z78.9

## 2016-01-25 LAB — URINALYSIS, ROUTINE W REFLEX MICROSCOPIC
Bilirubin Urine: NEGATIVE
Glucose, UA: NEGATIVE mg/dL
Ketones, ur: NEGATIVE mg/dL
Nitrite: NEGATIVE
Protein, ur: NEGATIVE mg/dL
Specific Gravity, Urine: 1.02 (ref 1.005–1.030)
pH: 6 (ref 5.0–8.0)

## 2016-01-25 LAB — URINE MICROSCOPIC-ADD ON

## 2016-01-25 LAB — POCT PREGNANCY, URINE: Preg Test, Ur: POSITIVE — AB

## 2016-01-25 NOTE — MAU Provider Note (Signed)
Chief Complaint: Vaginal Discharge and Vaginal Bleeding   First Provider Initiated Contact with Patient 01/25/16 1630        SUBJECTIVE HPI: Kristina Holt is a 20 y.o. G1P0 at 7175w5d by LMP who presents to maternity admissions reporting red/brown spotting for the past few weeks. Had sex last night and then had some brown discharge followed by some red discharge, followed by clear discharge.  She denies vaginal itching/burning, urinary symptoms, h/a, dizziness, n/v, or fever/chills.   [facility] Initially seen on 01/06/16 for lower abdominal pain. US that day saw a 5 wk fetal pole with no heartbeat, putting EDC 6 days larger than LMP date. She was then seen on 01/15/16 for dark red bleeding. Repeat US showed a live SIUP with EDC within 2 days of LMP dates.  She was treated for a UTI there.  Just finished Keflex.  [facility] with Dr Charlotta Newtonzan and ultrasound. States "was a couple of minutes late" due to school and "they would not see me until Tuesday, and I HAVE to be seen today. States "they told me the hospital due dates are never right and I needed an US in the office to get a real due date". FOB questions "If I came in her in May, could that sperm stay there until July?"  [place] "to find all this out".  Patient states "I didn't sleep with anybody else".  Vaginal Bleeding  The patient's primary symptoms include missed menses and vaginal bleeding. The patient's pertinent negatives include no genital itching, genital lesions or genital odor. This is a recurrent problem. The current episode started in the past 7 days. The problem occurs intermittently. The problem has been unchanged. The pain is mild. The problem affects both sides. She is pregnant. Associated symptoms include abdominal pain. Pertinent negatives include no back pain, chills, constipation, diarrhea, fever, headaches, nausea or vomiting. The vaginal discharge was  bloody and brown. The vaginal bleeding is lighter than menses. She has not been passing clots. She has not been passing tissue. Nothing aggravates the symptoms. She has tried nothing for the symptoms. She is sexually active (Had sex last night). It is unknown whether or not her partner has an STD. She uses nothing for contraception.   RN Note: Has been having red/brown spotting.  Off and on last 2-3 wks.  [facility].  Was to have seen her OB/GYN today, but was too late for her appt. So she is here to be seen.  Yesterday had brown, red and clear d/c. [facility]. Has had some pain at times, none today    Past Medical History:  Diagnosis Date  . Medical history non-contributory    Past Surgical History:  Procedure Laterality Date  . NO PAST SURGERIES     Social History   Social History  . Marital status: Single    Spouse name: N/A  . Number of children: N/A  . Years of education: N/A   Occupational History  . Not on file.   Social History Main Topics  . Smoking status: Never Smoker  . Smokeless tobacco: Never Used  . Alcohol use No  . Drug use: No  . Sexual activity: Yes    Birth control/ protection: None, Pill   Other Topics Concern  . Not on file   Social History Narrative  . No narrative on file  No current facility-administered medications on file prior to encounter.    Current Outpatient Prescriptions on File Prior to Encounter  Medication Sig Dispense Refill  . cephALEXin (KEFLEX) 500 MG capsule Take 1 capsule (500 mg total) by mouth 2 (two) times daily. (Patient not taking: Reported on 01/25/2016) 10 capsule 0   Allergies  Allergen Reactions  . Amoxicillin Hives and Other (See Comments)    Has patient had a PCN reaction causing immediate rash, facial/tongue/throat swelling, SOB or lightheadedness with hypotension: No Has patient had a PCN reaction causing severe rash involving mucus membranes or skin necrosis: No Has  patient had a PCN reaction that required hospitalization No Has patient had a PCN reaction occurring within the last 10 years: No If all of the above answers are "NO", then may proceed with Cephalosporin use.  [place] Extract Swelling and Other (See Comments)    Reaction:  Lip swelling  . Penicillins Hives and Other (See Comments)    Has patient had a PCN reaction causing immediate rash, facial/tongue/throat swelling, SOB or lightheadedness with hypotension: No Has patient had a PCN reaction causing severe rash involving mucus membranes or skin necrosis: No Has patient had a PCN reaction that required hospitalization No Has patient had a PCN reaction occurring within the last 10 years: No If all of the above answers are "NO", then may proceed with Cephalosporin use.    I have reviewed patient's Past Medical Hx, Surgical Hx, Family Hx, Social Hx, medications and allergies.   ROS:  Review of Systems  Constitutional: Negative for chills and fever.  Gastrointestinal: Positive for abdominal pain. Negative for constipation, diarrhea, nausea and vomiting.  Genitourinary: Positive for missed menses and vaginal bleeding.  Musculoskeletal: Negative for back pain.  Neurological: Negative for headaches.   Other systems negative  Physical Exam  Patient Vitals for the past 24 hrs:  BP Temp Temp src Pulse Resp Height Weight  01/25/16 1349 (!) 118/53 98.6 F (37 C) Oral 78 16 5' 6.5" (1.689 m) 180 lb 12.8 oz (82 kg)   Physical Exam  Constitutional: Well-developed, well-nourished female in no acute distress.  Cardiovascular: normal rate Respiratory: normal effort GI: Abd soft, non-tender. Pos BS x 4 MS: Extremities nontender, no edema, normal ROM Neurologic: Alert and oriented x 4.  GU: Neg CVAT.  PELVIC EXAM: Cervix 0/long/high, firm, anterior, neg CMT, uterus nontender, adnexa without tenderness, enlargement, or mass    LAB RESULTS Results for orders placed or performed during the  hospital encounter of 01/25/16 (from the past 24 hour(s))  Urinalysis, Routine w reflex microscopic (not at [facility])     Status: Abnormal   Collection Time: 01/25/16  1:50 PM  Result Value Ref Range   Color, Urine YELLOW YELLOW   APPearance CLEAR CLEAR   Specific Gravity, Urine 1.020 1.005 - 1.030   pH 6.0 5.0 - 8.0   Glucose, UA NEGATIVE NEGATIVE mg/dL   Hgb urine dipstick TRACE (A) NEGATIVE   Bilirubin Urine NEGATIVE NEGATIVE   Ketones, ur NEGATIVE NEGATIVE mg/dL   Protein, ur NEGATIVE NEGATIVE mg/dL   Nitrite NEGATIVE NEGATIVE   Leukocytes, UA MODERATE (A) NEGATIVE  Urine microscopic-add on     Status: Abnormal   Collection Time: 01/25/16  1:50 PM  Result Value Ref Range   Squamous Epithelial / LPF 0-5 (A) NONE SEEN   WBC, UA 0-5 0 - 5 WBC/hpf   RBC / HPF 0-5 0 - 5 RBC/hpf   Bacteria, UA FEW (A) NONE SEEN  Urine-Other MUCOUS PRESENT   Pregnancy, urine POC     Status: Abnormal   Collection Time: 01/25/16  2:44 PM  Result Value Ref Range   Preg Test, Ur POSITIVE (A) NEGATIVE    --/--/O POS (08/20 1344)  IMAGING US Ob Comp Less 14 Wks  Result Date: 01/15/2016 CLINICAL DATA:  Pregnant patient with vaginal discharge and bleeding. EXAM: OBSTETRIC <14 WK [place] AND TRANSVAGINAL OB US TECHNIQUE: Both transabdominal and transvaginal ultrasound examinations were performed for complete evaluation of the gestation as well as the maternal uterus, adnexal regions, and pelvic cul-de-sac. Transvaginal technique was performed to assess early pregnancy. COMPARISON:  None. FINDINGS: Intrauterine gestational sac: A single gestational sac is identified. Yolk sac:  A yolk sac is identified. Embryo:  A single embryo is noted. Cardiac Activity: Present Heart Rate: 144  bpm MSD:   mm    w     d CRL:  6.8  mm   6 w   4 d                  [place] [facility]: September 05, 2016 Subchorionic hemorrhage:  None Maternal uterus/adnexae: The ovaries are normal in appearance. IMPRESSION: Single live IUP with no cause for bleeding  identified. Electronically Signed   By: Gerome Sam III M.D   On: 01/15/2016 16:11   US Ob Comp Less 14 Wks  Result Date: 01/06/2016 CLINICAL DATA:  Right pelvic pain for 5 days. Quantitative beta HCG is pending. LMP was07/11/2015. Gestational age by LMP is5 weeks 0 days. EDC by LMP is04/13/2018. EXAM: OBSTETRIC <14 WK [place] AND TRANSVAGINAL OB US TECHNIQUE: Both transabdominal and transvaginal ultrasound examinations were performed for complete evaluation of the gestation as well as the maternal uterus, adnexal regions, and pelvic cul-de-sac. Transvaginal technique was performed to assess early pregnancy. COMPARISON:  None. FINDINGS: Intrauterine gestational sac: Present Yolk sac:  Present Embryo:  Present Cardiac Activity: Not seen Heart Rate: Absent  bpm CRL:  2.6  mm   5 w   6 d                  [place] EDC: 09/01/2016 Subchorionic hemorrhage:  None visualized. Maternal uterus/adnexae: The ovaries have a normal appearance. No free pelvic fluid. IMPRESSION: 1. Intrauterine embryo identified. Cardiac activity is not yet present. Followup ultrasound is suggested in 10-14 days to document presence of cardiac activity and to confirm dating. 2. Dating by today's exam differs from clinical dating. 3. Ultrasound EDC is 09/01/2016. Electronically Signed   By: Norva Pavlov M.D.   On: 01/06/2016 14:59   US Ob Transvaginal  Result Date: 01/15/2016 CLINICAL DATA:  Pregnant patient with vaginal discharge and bleeding. EXAM: OBSTETRIC <14 WK [place] AND TRANSVAGINAL OB US TECHNIQUE: Both transabdominal and transvaginal ultrasound examinations were performed for complete evaluation of the gestation as well as the maternal uterus, adnexal regions, and pelvic cul-de-sac. Transvaginal technique was performed to assess early pregnancy. COMPARISON:  None. FINDINGS: Intrauterine gestational sac: A single gestational sac is identified. Yolk sac:  A yolk sac is identified. Embryo:  A single embryo is noted. Cardiac Activity: Present  Heart Rate: 144  bpm MSD:   mm    w     d CRL:  6.8  mm   6 w   4 d                  [place] [facility]: September 05, 2016 Subchorionic hemorrhage:  None Maternal uterus/adnexae: The ovaries are normal in  appearance. IMPRESSION: Single live IUP with no cause for bleeding identified. Electronically Signed   By: Gerome Sam III M.D   On: 01/15/2016 16:11   US Ob Transvaginal  Result Date: 01/06/2016 CLINICAL DATA:  Right pelvic pain for 5 days. Quantitative beta HCG is pending. LMP was07/11/2015. Gestational age by LMP is5 weeks 0 days. EDC by LMP is04/13/2018. EXAM: OBSTETRIC <14 WK [place] AND TRANSVAGINAL OB US TECHNIQUE: Both transabdominal and transvaginal ultrasound examinations were performed for complete evaluation of the gestation as well as the maternal uterus, adnexal regions, and pelvic cul-de-sac. Transvaginal technique was performed to assess early pregnancy. COMPARISON:  None. FINDINGS: Intrauterine gestational sac: Present Yolk sac:  Present Embryo:  Present Cardiac Activity: Not seen Heart Rate: Absent  bpm CRL:  2.6  mm   5 w   6 d                  [place] EDC: 09/01/2016 Subchorionic hemorrhage:  None visualized. Maternal uterus/adnexae: The ovaries have a normal appearance. No free pelvic fluid. IMPRESSION: 1. Intrauterine embryo identified. Cardiac activity is not yet present. Followup ultrasound is suggested in 10-14 days to document presence of cardiac activity and to confirm dating. 2. Dating by today's exam differs from clinical dating. 3. Ultrasound EDC is 09/01/2016. Electronically Signed   By: Norva Pavlov M.D.   On: 01/06/2016 14:59   BEDSIDE US TODAY: Single gestational sac, intrauterine Single fetus in sac, CRL c/w [redacted]w[redacted]d Yolk sac seen, appears normal Fetal heart rate 155   MAU Management/MDM: Discussed with Dr Charlotta Newton, presentation and history She suggested I do a bedside US to confirm live fetus and have her followup in office Bedside US done, as above Cervix long and closed, minimal  blood ER note states there is a subchorionic hemorrhage, but [place] report does not.  ASSESSMENT 1. Subchorionic hemorrhage in first trimester   2.    Suspect also has a post-coital bleeding component  PLAN Discharge home Suggested she follow up with office for new ob visit Discussed that determination of conception date is not an easy topic, and it is inappropriate for me to discuss it in this ED setting. Suggested he talk to Dr Charlotta Newton about it.     Medication List    STOP taking these medications   cephALEXin 500 MG capsule Commonly known as:  KEFLEX     TAKE these medications   VITAFOL PO Take 1 tablet by mouth every morning.      Follow-up Information    Myna Hidalgo, M, DO. Schedule an appointment as soon as possible for a visit today.   Specialty:  Obstetrics and Gynecology Contact information: [address] [place] [address] [phone]          Pt stable at time of discharge. Encouraged to return here or to other Urgent Care/ED if she develops worsening of symptoms, increase in pain, fever, or other concerning symptoms.    Wynelle Bourgeois CNM, MSN Certified Nurse-Midwife 01/25/2016  5:01 PM

## 2016-01-25 NOTE — MAU Note (Signed)
Has been having red/brown spotting.  Off and on last 2-3 wks.  [facility].  Was to have seen her OB/GYN today, but was too late for her appt. So she is here to be seen.  Yesterday had brown, red and clear d/c. [facility], [facility]. Has had some pain at times, none today

## 2016-01-25 NOTE — Discharge Instructions (Signed)
Pelvic Rest Pelvic rest is sometimes recommended for women when:   The placenta is partially or completely covering the opening of the cervix (placenta previa).  There is bleeding between the uterine wall and the amniotic sac in the first trimester (subchorionic hemorrhage).  The cervix begins to open without labor starting (incompetent cervix, cervical insufficiency).  The labor is too early (preterm labor). HOME CARE INSTRUCTIONS  Do not have sexual intercourse, stimulation, or an orgasm.  Do not use tampons, douche, or put anything in the vagina.  Do not lift anything over 10 pounds (4.5 kg).  Avoid strenuous activity or straining your pelvic muscles. SEEK MEDICAL CARE IF:  You have any vaginal bleeding during pregnancy. Treat this as a potential emergency.  You have cramping pain felt low in the stomach (stronger than menstrual cramps).  You notice vaginal discharge (watery, mucus, or bloody).  You have a low, dull backache.  There are regular contractions or uterine tightening. SEEK IMMEDIATE MEDICAL CARE IF: You have vaginal bleeding and have placenta previa.    This information is not intended to replace advice given to you by your health care provider. Make sure you discuss any questions you have with your health care provider.   Document Released: 09/08/2010 Document Revised: 08/06/2011 Document Reviewed: 11/15/2014 Elsevier Interactive Patient Education 2016 Elsevier Inc.   Vaginal Bleeding During Pregnancy, First Trimester A small amount of bleeding (spotting) from the vagina is relatively common in early pregnancy. It usually stops on its own. Various things may cause bleeding or spotting in early pregnancy. Some bleeding may be related to the pregnancy, and some may not. In most cases, the bleeding is normal and is not a problem. However, bleeding can also be a sign of something serious. Be sure to tell your health care provider about any vaginal bleeding right  away. Some possible causes of vaginal bleeding during the first trimester include:  Infection or inflammation of the cervix.  Growths (polyps) on the cervix.  Miscarriage or threatened miscarriage.  Implantation bleeding, sometimes called a subchorionic hematoma (where baby implants into the lining of uterus, bleeds at site) HOME CARE INSTRUCTIO Watch your condition for any changes. The following actions may help to lessen any discomfort you are feeling:  Follow your health care provider's instructions for limiting your activity. If your health care provider orders bed rest, you may need to stay in bed and only get up to use the bathroom. However, your health care provider may allow you to continue light activity.  If needed, make plans for someone to help with your regular activities and responsibilities while you are on bed rest.  Keep track of the number of pads you use each day, how often you change pads, and how soaked (saturated) they are. Write this down.  Do not use tampons. Do not douche.  Do not have sexual intercourse or orgasms until approved by your health care provider.  If you pass any tissue from your vagina, save the tissue so you can show it to your health care provider.  Only take over-the-counter or prescription medicines as directed by your health care provider.  Do not take aspirin because it can make you bleed.  Keep all follow-up appointments as directed by your health care provider. SEEK MEDICAL CARE IF:  You have any vaginal bleeding during any part of your pregnancy.  You have cramps or labor pains.  You have a fever, not controlled by medicine. SEEK IMMEDIATE MEDICAL CARE IF:   You  have severe cramps in your back or belly (abdomen).  You pass large clots or tissue from your vagina.  Your bleeding increases.  You feel light-headed or weak, or you have fainting episodes.  You have chills.  You are leaking fluid or have a gush of fluid from  your vagina.  You pass out while having a bowel movement. MAKE SURE YOU:  Understand these instructions.  Will watch your condition.  Will get help right away if you are not doing well or get worse.   This information is not intended to replace advice given to you by your health care provider. Make sure you discuss any questions you have with your health care provider.   Document Released: 02/21/2005 Document Revised: 05/19/2013 Document Reviewed: 01/19/2013 Elsevier Interactive Patient Education [employer]

## 2016-03-09 LAB — OB RESULTS CONSOLE ABO/RH: RH Type: POSITIVE

## 2016-03-09 LAB — OB RESULTS CONSOLE HIV ANTIBODY (ROUTINE TESTING): HIV: NONREACTIVE

## 2016-03-09 LAB — OB RESULTS CONSOLE ANTIBODY SCREEN: Antibody Screen: NEGATIVE

## 2016-03-09 LAB — OB RESULTS CONSOLE RUBELLA ANTIBODY, IGM: Rubella: NON-IMMUNE/NOT IMMUNE

## 2016-03-09 LAB — OB RESULTS CONSOLE GC/CHLAMYDIA
Chlamydia: NEGATIVE
Gonorrhea: NEGATIVE

## 2016-03-09 LAB — OB RESULTS CONSOLE RPR: RPR: NONREACTIVE

## 2016-03-09 LAB — OB RESULTS CONSOLE HEPATITIS B SURFACE ANTIGEN: Hepatitis B Surface Ag: NEGATIVE

## 2016-05-08 ENCOUNTER — Encounter (HOSPITAL_COMMUNITY): Payer: Self-pay

## 2016-05-08 ENCOUNTER — Inpatient Hospital Stay (HOSPITAL_COMMUNITY)
Admission: AD | Admit: 2016-05-08 | Discharge: 2016-05-09 | Disposition: A | Discharge status: Home / Self Care | Payer: BLUE CROSS/BLUE SHIELD | Source: Ambulatory Visit | Attending: Obstetrics and Gynecology | Admitting: Obstetrics and Gynecology

## 2016-05-08 DIAGNOSIS — Z3A22 22 weeks gestation of pregnancy: Secondary | ICD-10-CM | POA: Diagnosis not present

## 2016-05-08 DIAGNOSIS — M549 Dorsalgia, unspecified: Secondary | ICD-10-CM

## 2016-05-08 DIAGNOSIS — M545 Low back pain: Secondary | ICD-10-CM | POA: Insufficient documentation

## 2016-05-08 DIAGNOSIS — O9989 Other specified diseases and conditions complicating pregnancy, childbirth and the puerperium: Secondary | ICD-10-CM

## 2016-05-08 DIAGNOSIS — O26892 Other specified pregnancy related conditions, second trimester: Secondary | ICD-10-CM | POA: Diagnosis not present

## 2016-05-08 DIAGNOSIS — Z880 Allergy status to penicillin: Secondary | ICD-10-CM | POA: Diagnosis not present

## 2016-05-08 DIAGNOSIS — O99891 Other specified diseases and conditions complicating pregnancy: Secondary | ICD-10-CM

## 2016-05-08 LAB — URINALYSIS, ROUTINE W REFLEX MICROSCOPIC
Bilirubin Urine: NEGATIVE
Glucose, UA: NEGATIVE mg/dL
Hgb urine dipstick: NEGATIVE
Ketones, ur: NEGATIVE mg/dL
Leukocytes, UA: NEGATIVE
Nitrite: NEGATIVE
Protein, ur: NEGATIVE mg/dL
Specific Gravity, Urine: 1.009 (ref 1.005–1.030)
pH: 7 (ref 5.0–8.0)

## 2016-05-08 MED ORDER — CYCLOBENZAPRINE HCL 10 MG PO TABS
10.0000 mg | ORAL_TABLET | Freq: Once | ORAL | Status: AC
  Administered 2016-05-08: 10 mg via ORAL
  Filled 2016-05-08: qty 1

## 2016-05-08 NOTE — MAU Note (Signed)
Pt reports really bad lower back pain since she got up this am, nausea off/on

## 2016-05-09 DIAGNOSIS — O26892 Other specified pregnancy related conditions, second trimester: Secondary | ICD-10-CM

## 2016-05-09 DIAGNOSIS — M549 Dorsalgia, unspecified: Secondary | ICD-10-CM

## 2016-05-09 NOTE — Discharge Instructions (Signed)
Back Pain in Pregnancy Introduction Back pain during pregnancy is common. Back pain may be caused by several factors that are related to changes during your pregnancy. Follow these instructions at home: Managing pain, stiffness, and swelling  If directed, apply ice for sudden (acute) back pain.  Put ice in a plastic bag.  Place a towel between your skin and the bag.  Leave the ice on for 20 minutes, 2-3 times per day.  If directed, apply heat to the affected area before you exercise:  Place a towel between your skin and the heat pack or heating pad.  Leave the heat on for 20-30 minutes.  Remove the heat if your skin turns bright red. This is especially important if you are unable to feel pain, heat, or cold. You may have a greater risk of getting burned. Activity  Exercise as told by your health care provider. Exercising is the best way to prevent or manage back pain.  Listen to your body when lifting. If lifting hurts, ask for help or bend your knees. This uses your leg muscles instead of your back muscles.  Squat down when picking up something from the floor. Do not bend over.  Only use bed rest as told by your health care provider. Bed rest should only be used for the most severe episodes of back pain. Standing, Sitting, and Lying Down  Do not stand in one place for long periods of time.  Use good posture when sitting. Make sure your head rests over your shoulders and is not hanging forward. Use a pillow on your lower back if necessary.  Try sleeping on your side, preferably the left side, with a pillow or two between your legs. If you are sore after a night's rest, your bed may be too soft. A firm mattress may provide more support for your back during pregnancy. General instructions  Do not wear high heels.  Eat a healthy diet. Try to gain weight within your health care provider's recommendations.  Use a maternity girdle, elastic sling, or back brace as told by your  health care provider.  Take over-the-counter and prescription medicines only as told by your health care provider.  Keep all follow-up visits as told by your health care provider. This is important. This includes any visits with any specialists, such as a physical therapist. Contact a health care provider if:  Your back pain interferes with your daily activities.  You have increasing pain in other parts of your body. Get help right away if:  You develop numbness, tingling, weakness, or problems with the use of your arms or legs.  You develop severe back pain that is not controlled with medicine.  You have a sudden change in bowel or bladder control.  You develop shortness of breath, dizziness, or you faint.  You develop nausea, vomiting, or sweating.  You have back pain that is a rhythmic, cramping pain similar to labor pains. Labor pain is usually 1-2 minutes apart, lasts for about 1 minute, and involves a bearing down feeling or pressure in your pelvis.  You have back pain and your water breaks or you have vaginal bleeding.  You have back pain or numbness that travels down your leg.  Your back pain developed after you fell.  You develop pain on one side of your back.  You see blood in your urine.  You develop skin blisters in the area of your back pain. This information is not intended to replace advice given to   you by your health care provider. Make sure you discuss any questions you have with your health care provider. Document Released: 08/22/2005 Document Revised: 10/20/2015 Document Reviewed: 01/26/2015  2017 Elsevier  

## 2016-05-09 NOTE — MAU Provider Note (Signed)
Chief Complaint:  Back Pain   First Provider Initiated Contact with Patient 05/08/16 2326     HPI: Kristina ColaceChelsea Holt is a 20 y.o. G1P0 at 3139w5d who presents to maternity admissions reporting back pain and nausea off and on today./   Location: lower back Quality: aching, constant Severity: 7/10 in pain scale Duration: 1 day Context: Timing: when she woke up Modifying factors: heat, ice and tylenol did not help Signs and symptoms:  Denies contractions, leakage of fluid or vaginal bleeding. Good fetal movement.   Pregnancy Course:   Past Medical History: Past Medical History:  Diagnosis Date  . Medical history non-contributory     Past obstetric history: OB History  Gravida Para Term Preterm AB Living  1            SAB TAB Ectopic Multiple Live Births               # Outcome Date GA Lbr Len/2nd Weight Sex Delivery Anes PTL Lv  1 Current               Past Surgical History: Past Surgical History:  Procedure Laterality Date  . NO PAST SURGERIES       Family History: No family history on file.  Social History: Social History  Substance Use Topics  . Smoking status: Never Smoker  . Smokeless tobacco: Never Used  . Alcohol use No    Allergies:  Allergies  Allergen Reactions  . Amoxicillin Hives and Other (See Comments)    Has patient had a PCN reaction causing immediate rash, facial/tongue/throat swelling, SOB or lightheadedness with hypotension: No Has patient had a PCN reaction causing severe rash involving mucus membranes or skin necrosis: No Has patient had a PCN reaction that required hospitalization No Has patient had a PCN reaction occurring within the last 10 years: No If all of the above answers are "NO", then may proceed with Cephalosporin use.  Henderson [place]. Kiwi Extract Swelling and Other (See Comments)    Reaction:  Lip swelling  . Penicillins Hives and Other (See Comments)    Has patient had a PCN reaction causing immediate rash, facial/tongue/throat  swelling, SOB or lightheadedness with hypotension: No Has patient had a PCN reaction causing severe rash involving mucus membranes or skin necrosis: No Has patient had a PCN reaction that required hospitalization No Has patient had a PCN reaction occurring within the last 10 years: No If all of the above answers are "NO", then may proceed with Cephalosporin use.    Meds:  Prescriptions Prior to Admission  Medication Sig Dispense Refill Last Dose  . Iron-Vitamins (VITAFOL PO) Take 1 tablet by mouth every morning.   05/08/2016 at Unknown time    I have reviewed patient's Past Medical Hx, Surgical Hx, Family Hx, Social Hx, medications and allergies.   ROS:  Review of Systems Negative for headaches, blurry vision, chest pain, shortness of breath. Denies dysuria, urgency or  vaginal discharge or bleeding.    Physical Exam  Patient Vitals for the past 24 hrs:  BP Temp Temp src Pulse Resp SpO2 Height Weight  05/08/16 2203 125/67 98.7 F (37.1 C) Oral 67 18 100 % 5' 6.5" (1.689 m) 192 lb (87.1 kg)   Constitutional: Well-developed, well-nourished female in no acute distress.  Cardiovascular: normal rate Respiratory: normal effort GI: Abd soft, non-tender, gravid appropriate for gestational age. Pos BS x 4 MS: Extremities nontender, no edema, normal ROM Neurologic: Alert and oriented x 4.  GU: Neg CVAT.  Pelvic: NEFG, physiologic discharge, no blood, cervix clean. No CVAT  Dilation: Closed Effacement (%): Thick Cervical Position: Posterior Exam by:: K. Kooistra CNM  FHT:  Auscultated at 154.   Labs: Results for orders placed or performed during the hospital encounter of 05/08/16 (from the past 24 hour(s))  Urinalysis, Routine w reflex microscopic     Status: Abnormal   Collection Time: 05/08/16 10:05 PM  Result Value Ref Range   Color, Urine STRAW (A) YELLOW   APPearance CLEAR CLEAR   Specific Gravity, Urine 1.009 1.005 - 1.030   pH 7.0 5.0 - 8.0   Glucose, UA NEGATIVE NEGATIVE  mg/dL   Hgb urine dipstick NEGATIVE NEGATIVE   Bilirubin Urine NEGATIVE NEGATIVE   Ketones, ur NEGATIVE NEGATIVE mg/dL   Protein, ur NEGATIVE NEGATIVE mg/dL   Nitrite NEGATIVE NEGATIVE   Leukocytes, UA NEGATIVE NEGATIVE    Imaging:  No results found.  MAU Course: -toco, bimanual exam MDM: -patient has no contractions on toco  Assessment: 20 year old G1P0 here with musculo-skeletal lower back-pain that has been ongoing since this morning.  Denies dysuria, vaginal discharge or bleeding. Will send urine cultures. Cervix is closed and long and thick.  Plan: Discharge home in stable condition. She does not want flexeril, I recommended tylenol (less than 4 grams per day) and discussed importance of not taking ibuprofen in regnancy. Back pain in pregnancy recommendations given (belly band, stretching, support pillows at night). Encourage patient to stretch out her lower back every day and exercise. Patient says the Discussed with Dr. Vincente PoliGrewal, who agrees.       Kristina GaribaldiKathryn Lorraine FalconKooistra, CNM 05/09/2016 12:18 AM

## 2016-05-10 LAB — CULTURE, OB URINE

## 2016-08-10 ENCOUNTER — Encounter (HOSPITAL_COMMUNITY): Payer: Self-pay

## 2016-08-10 ENCOUNTER — Observation Stay (HOSPITAL_COMMUNITY)
Admission: AD | Admit: 2016-08-10 | Discharge: 2016-08-11 | Disposition: A | Discharge status: Home / Self Care | Payer: BLUE CROSS/BLUE SHIELD | Source: Ambulatory Visit | Attending: Obstetrics and Gynecology | Admitting: Obstetrics and Gynecology

## 2016-08-10 DIAGNOSIS — O368390 Maternal care for abnormalities of the fetal heart rate or rhythm, unspecified trimester, not applicable or unspecified: Secondary | ICD-10-CM | POA: Diagnosis present

## 2016-08-10 DIAGNOSIS — Z3A36 36 weeks gestation of pregnancy: Secondary | ICD-10-CM | POA: Diagnosis not present

## 2016-08-10 DIAGNOSIS — Z3490 Encounter for supervision of normal pregnancy, unspecified, unspecified trimester: Secondary | ICD-10-CM

## 2016-08-10 LAB — COMPREHENSIVE METABOLIC PANEL
ALT: 9 U/L — ABNORMAL LOW (ref 14–54)
AST: 12 U/L — ABNORMAL LOW (ref 15–41)
Albumin: 3.1 g/dL — ABNORMAL LOW (ref 3.5–5.0)
Alkaline Phosphatase: 89 U/L (ref 38–126)
Anion gap: 11 (ref 5–15)
BUN: 9 mg/dL (ref 6–20)
CO2: 23 mmol/L (ref 22–32)
Calcium: 8.4 mg/dL — ABNORMAL LOW (ref 8.9–10.3)
Chloride: 101 mmol/L (ref 101–111)
Creatinine, Ser: 0.72 mg/dL (ref 0.44–1.00)
GFR calc Af Amer: >60 mL/min (ref >60)
GFR calc non Af Amer: >60 mL/min (ref >60)
Glucose, Bld: 90 mg/dL (ref 65–99)
Potassium: 3.9 mmol/L (ref 3.5–5.1)
Sodium: 135 mmol/L (ref 135–145)
Total Bilirubin: 0.8 mg/dL (ref 0.3–1.2)
Total Protein: 6.4 g/dL — ABNORMAL LOW (ref 6.5–8.1)

## 2016-08-10 LAB — CBC WITH DIFFERENTIAL/PLATELET
Basophils Absolute: 0 10*3/uL (ref 0.0–0.1)
Basophils Relative: 0 %
Eosinophils Absolute: 0.1 10*3/uL (ref 0.0–0.7)
Eosinophils Relative: 0 %
HCT: 35.4 % — ABNORMAL LOW (ref 36.0–46.0)
Hemoglobin: 11.9 g/dL — ABNORMAL LOW (ref 12.0–15.0)
Lymphocytes Relative: 6 %
Lymphs Abs: 0.9 10*3/uL (ref 0.7–4.0)
MCH: 30.1 pg (ref 26.0–34.0)
MCHC: 33.6 g/dL (ref 30.0–36.0)
MCV: 89.6 fL (ref 78.0–100.0)
Monocytes Absolute: 0.3 10*3/uL (ref 0.1–1.0)
Monocytes Relative: 2 %
Neutro Abs: 13.2 10*3/uL — ABNORMAL HIGH (ref 1.7–7.7)
Neutrophils Relative %: 92 %
Platelets: 211 10*3/uL (ref 150–400)
RBC: 3.95 MIL/uL (ref 3.87–5.11)
RDW: 13.8 % (ref 11.5–15.5)
WBC: 14.5 10*3/uL — ABNORMAL HIGH (ref 4.0–10.5)

## 2016-08-10 MED ORDER — ONDANSETRON HCL 4 MG/2ML IJ SOLN
4.0000 mg | Freq: Once | INTRAMUSCULAR | Status: AC
  Administered 2016-08-10: 4 mg via INTRAVENOUS
  Filled 2016-08-10: qty 2

## 2016-08-10 MED ORDER — LACTATED RINGERS IV BOLUS (SEPSIS)
1000.0000 mL | Freq: Once | INTRAVENOUS | Status: AC
  Administered 2016-08-10: 1000 mL via INTRAVENOUS

## 2016-08-10 MED ORDER — ACETAMINOPHEN 325 MG PO TABS: 650.0000 mg | ORAL_TABLET | ORAL | Status: DC | PRN

## 2016-08-10 MED ORDER — ZOLPIDEM TARTRATE 5 MG PO TABS
5.0000 mg | ORAL_TABLET | Freq: Every evening | ORAL | Status: DC | PRN
  Administered 2016-08-10: 5 mg via ORAL
  Filled 2016-08-10: qty 1

## 2016-08-10 MED ORDER — LACTATED RINGERS IV SOLN
INTRAVENOUS | Status: DC
  Administered 2016-08-10 – 2016-08-11 (×3): via INTRAVENOUS

## 2016-08-10 MED ORDER — CALCIUM CARBONATE ANTACID 500 MG PO CHEW
2.0000 | CHEWABLE_TABLET | ORAL | Status: DC | PRN
Start: 2016-08-10 — End: 2016-08-11
  Administered 2016-08-10: 400 mg via ORAL
  Filled 2016-08-10: qty 2

## 2016-08-10 MED ORDER — PRENATAL MULTIVITAMIN CH
1.0000 | ORAL_TABLET | Freq: Every day | ORAL | Status: DC
  Filled 2016-08-10: qty 1

## 2016-08-10 MED ORDER — DOCUSATE SODIUM 100 MG PO CAPS
100.0000 mg | ORAL_CAPSULE | Freq: Every day | ORAL | Status: DC
  Filled 2016-08-10: qty 1

## 2016-08-10 NOTE — MAU Provider Note (Signed)
History     CSN: [id number]  Arrival date and time: 08/10/16 2055   First Provider Initiated Contact with Patient 08/10/16 2133      Chief Complaint  Patient presents with  . Emesis During Pregnancy  . Diarrhea  . Contractions   HPI Ms. Kristina Holt is a 21 y.o. G1P0 at 5372w0d who presents to MAU today with complaint of nausea since yesterday off and on, then constant today. She states emesis and diarrhea started around 1600. She then noted frequent, painful contractions. She is unsure about timing. She denies vaginal bleeding, LOF, fever or sick contacts. She reports good fetal movement.   OB History    Gravida Para Term Preterm AB Living   1             SAB TAB Ectopic Multiple Live Births                  Past Medical History:  Diagnosis Date  . Medical history non-contributory     Past Surgical History:  Procedure Laterality Date  . NO PAST SURGERIES      No family history on file.  Social History  Substance Use Topics  . Smoking status: Never Smoker  . Smokeless tobacco: Never Used  . Alcohol use No    Allergies:  Allergies  Allergen Reactions  . Amoxicillin Hives and Other (See Comments)    Has patient had a PCN reaction causing immediate rash, facial/tongue/throat swelling, SOB or lightheadedness with hypotension: No Has patient had a PCN reaction causing severe rash involving mucus membranes or skin necrosis: No Has patient had a PCN reaction that required hospitalization No Has patient had a PCN reaction occurring within the last 10 years: No If all of the above answers are "NO", then may proceed with Cephalosporin use.  Henderson [place]. Kiwi Extract Swelling and Other (See Comments)    Reaction:  Lip swelling  . Penicillins Hives and Other (See Comments)    Has patient had a PCN reaction causing immediate rash, facial/tongue/throat swelling, SOB or lightheadedness with hypotension: No Has patient had a PCN reaction causing severe rash involving mucus membranes  or skin necrosis: No Has patient had a PCN reaction that required hospitalization No Has patient had a PCN reaction occurring within the last 10 years: No If all of the above answers are "NO", then may proceed with Cephalosporin use.    Prescriptions Prior to Admission  Medication Sig Dispense Refill Last Dose  . acetaminophen (TYLENOL) 500 MG tablet Take 1,000 mg by mouth every 6 (six) hours as needed for mild pain or headache.   08/10/2016 at Unknown time  . Prenatal Vit-Fe Fumarate-FA (PRENATAL MULTIVITAMIN) TABS tablet Take 1 tablet by mouth daily at 12 noon.   08/10/2016 at Unknown time    Review of Systems  Constitutional: Negative for fever.  Gastrointestinal: Positive for abdominal pain, diarrhea, nausea and vomiting. Negative for constipation.  Genitourinary: Negative for dysuria, frequency, urgency, vaginal bleeding and vaginal discharge.   Physical Exam   Blood pressure 121/70, pulse 100, temperature 98.2 F (36.8 C), resp. rate 20, height 5\' 8"  (1.727 m), weight 214 lb (97.1 kg), last menstrual period 12/02/2015.  Physical Exam  Nursing note and vitals reviewed. Constitutional: She is oriented to person, place, and time. She appears well-developed and well-nourished. No distress.  HENT:  Head: Normocephalic and atraumatic.  Cardiovascular: Normal rate.   Respiratory: Effort normal.  GI: Soft. She exhibits no distension and no mass. There is no tenderness.  There is no rebound and no guarding.  Neurological: She is alert and oriented to person, place, and time.  Skin: Skin is warm and dry. No erythema.  Psychiatric: She has a normal mood and affect.   Cervical exam:  Dilation: Fingertip Effacement (%): Thick Cervical Position: Posterior Exam by:: Remigio Eisenmenger, RN  Results for orders placed or performed during the hospital encounter of 08/10/16 (from the past 24 hour(s))  CBC with Differential/Platelet     Status: Abnormal   Collection Time: 08/10/16 10:02 PM   Result Value Ref Range   WBC 14.5 (H) 4.0 - 10.5 K/uL   RBC 3.95 3.87 - 5.11 MIL/uL   Hemoglobin 11.9 (L) 12.0 - 15.0 g/dL   HCT 16.1 (L) 09.6 - 04.5 %   MCV 89.6 78.0 - 100.0 fL   MCH 30.1 26.0 - 34.0 pg   MCHC 33.6 30.0 - 36.0 g/dL   RDW 40.9 81.1 - 91.4 %   Platelets 211 150 - 400 K/uL   Neutrophils Relative % 92 %   Neutro Abs 13.2 (H) 1.7 - 7.7 K/uL   Lymphocytes Relative 6 %   Lymphs Abs 0.9 0.7 - 4.0 K/uL   Monocytes Relative 2 %   Monocytes Absolute 0.3 0.1 - 1.0 K/uL   Eosinophils Relative 0 %   Eosinophils Absolute 0.1 0.0 - 0.7 K/uL   Basophils Relative 0 %   Basophils Absolute 0.0 0.0 - 0.1 K/uL  Comprehensive metabolic panel     Status: Abnormal   Collection Time: 08/10/16 10:02 PM  Result Value Ref Range   Sodium 135 135 - 145 mmol/L   Potassium 3.9 3.5 - 5.1 mmol/L   Chloride 101 101 - 111 mmol/L   CO2 23 22 - 32 mmol/L   Glucose, Bld 90 65 - 99 mg/dL   BUN 9 6 - 20 mg/dL   Creatinine, Ser 7.82 0.44 - 1.00 mg/dL   Calcium 8.4 (L) 8.9 - 10.3 mg/dL   Total Protein 6.4 (L) 6.5 - 8.1 g/dL   Albumin 3.1 (L) 3.5 - 5.0 g/dL   AST 12 (L) 15 - 41 U/L   ALT 9 (L) 14 - 54 U/L   Alkaline Phosphatase 89 38 - 126 U/L   Total Bilirubin 0.8 0.3 - 1.2 mg/dL   GFR calc non Af Amer >60 >60 mL/min   GFR calc Af Amer >60 >60 mL/min   Anion gap 11 5 - 15    Fetal Monitoring: Baseline: 140 bpm Variability: moderate Accelerations: 15 x 15 Decelerations: one prolonged decelerations into 60s x 7 minutes Contractions: q 2-5 minutes  MAU Course  Procedures None  MDM UA today  CBC, CMP, IV LR bolus with 4 mg IV Zofran given  After deceleration in FHR noted, spoke with Dr. Elon Spanner. Advised to admit to [facility] for observation overnight.   Assessment and Plan  A: [facility] at [redacted]w[redacted]d Nausea and vomiting in pregnancy, third trimester Fetal deceleration  P: Admit to [facility] for IV fluids, antiemetics and continuous EFM  Marny Lowenstein, PA-C  08/10/2016, 10:56 PM

## 2016-08-10 NOTE — MAU Note (Signed)
This afternoon started throwing up and then started having diarrhea. My stomach keeps getting hard and think I am contracting. No bleeding or d/c

## 2016-08-11 ENCOUNTER — Encounter (HOSPITAL_COMMUNITY): Payer: Self-pay

## 2016-08-11 ENCOUNTER — Inpatient Hospital Stay (HOSPITAL_COMMUNITY): Payer: BLUE CROSS/BLUE SHIELD

## 2016-08-11 DIAGNOSIS — Z3490 Encounter for supervision of normal pregnancy, unspecified, unspecified trimester: Secondary | ICD-10-CM

## 2016-08-11 LAB — URINALYSIS, ROUTINE W REFLEX MICROSCOPIC
Bilirubin Urine: NEGATIVE
Glucose, UA: NEGATIVE mg/dL
Hgb urine dipstick: NEGATIVE
Ketones, ur: 20 mg/dL — AB
Leukocytes, UA: NEGATIVE
Nitrite: NEGATIVE
Protein, ur: NEGATIVE mg/dL
Specific Gravity, Urine: 1.013 (ref 1.005–1.030)
pH: 7 (ref 5.0–8.0)

## 2016-08-11 LAB — TYPE AND SCREEN
ABO/RH(D): O POS
Antibody Screen: NEGATIVE

## 2016-08-11 LAB — ABO/RH: ABO/RH(D): O POS

## 2016-08-11 MED ORDER — ONDANSETRON HCL 4 MG PO TABS: 4.0000 mg | ORAL_TABLET | Freq: Three times a day (TID) | ORAL | 0 refills | Status: DC | PRN

## 2016-08-11 NOTE — ED Notes (Signed)
Tolerating PO. BPP 8/8. D/C home with precautions. EL

## 2016-08-11 NOTE — Progress Notes (Signed)
Discharge instructions given, questions answered, states understanding, signed and given copy. 

## 2016-08-11 NOTE — H&P (Signed)
Kristina Holt is a 21 y.o. female presenting for N/V and diarrhea that has now resolved. No fever, No sick contacts, no contractions, rash, and no LOF. She has good FM. Admitted s/s possible prolonged fetal deceleration on NST. She currently feels well and desires to go home.    OB History    Gravida Para Term Preterm AB Living   1             SAB TAB Ectopic Multiple Live Births                 Past Medical History:  Diagnosis Date  . Medical history non-contributory    Past Surgical History:  Procedure Laterality Date  . NO PAST SURGERIES     Family History: family history is not on file. Social History:  reports that she has never smoked. She has never used smokeless tobacco. She reports that she does not drink alcohol or use drugs.     Maternal Diabetes: No Genetic Screening: Normal Maternal Ultrasounds/Referrals: Normal Fetal Ultrasounds or other Referrals:  None Maternal Substance Abuse:  No Significant Maternal Medications:  None Significant Maternal Lab Results:  None Other Comments:  None  ROS History Dilation: Fingertip Effacement (%): Thick Exam by:: Remigio EisenmengerBenji Stanley, RN Blood pressure 120/68, pulse 71, temperature 98.6 F (37 C), temperature source Oral, resp. rate 18, height 5\' 8"  (1.727 m), weight 97.1 kg (214 lb), last menstrual period 12/02/2015. Exam Physical Exam  (from clinic) NAD, A&O, well appearing NWOB Abd soft, nondistended, gravid Ext no edema  Prenatal labs: ABO, Rh: --/--/O POS, O POS (03/16 2351) Antibody: NEG (03/16 2351) Rubella:   Non-immune RPR: Non Reactive (08/20 1247)  HBsAg:    HIV: Non Reactive (08/20 1247)  GBS:   pos  Assessment/Plan: 21yo G1P0 @ 36.1wga presenting w/N/V/D that has now resolved, admitted for observation given possible prolonged fetal decel x 6 min while feeling ill. She likely had food poisoning and this has now resolved. She was tachy on admission and now has a normal HR after IV hydration. She is able  to tolerate PO. FHT has been overall reassuring overnight. Will do BPP this AM and if 8/8, d/c home with precautions.    Kristina Holt 08/11/2016, 7:53 AM

## 2016-08-12 NOTE — Discharge Summary (Signed)
Obstetric Discharge Summary Reason for Admission: fetal decel. Had N/V/D. Fetus had a possible ~127min decel during nausea/emesis. Admitted for fetal monitoring. She was monitored overnight and had no further decels. BPP in the AM was 8/8. Her N/V/D all resolved and she felt improved with IVF. She was able to tolerate PO.  Prenatal Procedures: BPP Intrapartum Procedures:  N/A Postpartum Procedures: N/A Complications-Operative and Postpartum: N/A Hemoglobin  Date Value Ref Range Status  08/10/2016 11.9 (L) 12.0 - 15.0 g/dL Final   HCT  Date Value Ref Range Status  08/10/2016 35.4 (L) 36.0 - 46.0 % Final    Physical Exam:  General: alert, cooperative and appears stated age 37Lochia: none Abd: soft, nontender, gravid DVT Evaluation: Normal  Discharge Diagnoses: fetal deceleration  Discharge Information: Date: 08/12/2016 Activity: unrestricted Diet: routine Medications: zofran Condition: improved Instructions: refer to DCI/orders Discharge to: home   Kristina Holt Kristina Holt 08/12/2016, 12:26 PM

## 2016-08-31 ENCOUNTER — Encounter (HOSPITAL_COMMUNITY): Payer: Self-pay | Admitting: *Deleted

## 2016-08-31 ENCOUNTER — Telehealth (HOSPITAL_COMMUNITY): Payer: Self-pay | Admitting: *Deleted

## 2016-08-31 LAB — OB RESULTS CONSOLE GBS: GBS: NEGATIVE

## 2016-08-31 NOTE — Telephone Encounter (Signed)
Preadmission screen  

## 2016-09-04 ENCOUNTER — Encounter (HOSPITAL_COMMUNITY): Payer: Self-pay

## 2016-09-04 ENCOUNTER — Inpatient Hospital Stay (HOSPITAL_COMMUNITY): Payer: BLUE CROSS/BLUE SHIELD | Admitting: Anesthesiology

## 2016-09-04 ENCOUNTER — Encounter (HOSPITAL_COMMUNITY)
Admission: RE | Disposition: A | Discharge status: Home / Self Care | Payer: Self-pay | Source: Ambulatory Visit | Attending: Obstetrics and Gynecology

## 2016-09-04 ENCOUNTER — Inpatient Hospital Stay (HOSPITAL_COMMUNITY)
Admission: RE | Admit: 2016-09-04 | Discharge: 2016-09-07 | DRG: 766 | Disposition: A | Discharge status: Home / Self Care | Payer: BLUE CROSS/BLUE SHIELD | Source: Ambulatory Visit | Attending: Obstetrics and Gynecology | Admitting: Obstetrics and Gynecology

## 2016-09-04 VITALS — BP 117/68 | HR 77 | Temp 98.1°F | Resp 18 | Ht 68.0 in | Wt 220.0 lb

## 2016-09-04 DIAGNOSIS — Z3A40 40 weeks gestation of pregnancy: Secondary | ICD-10-CM

## 2016-09-04 DIAGNOSIS — O480 Post-term pregnancy: Secondary | ICD-10-CM | POA: Diagnosis present

## 2016-09-04 DIAGNOSIS — O99824 Streptococcus B carrier state complicating childbirth: Secondary | ICD-10-CM | POA: Diagnosis present

## 2016-09-04 DIAGNOSIS — Z98891 History of uterine scar from previous surgery: Secondary | ICD-10-CM

## 2016-09-04 DIAGNOSIS — Z3490 Encounter for supervision of normal pregnancy, unspecified, unspecified trimester: Secondary | ICD-10-CM

## 2016-09-04 LAB — CBC
HCT: 33.3 % — ABNORMAL LOW (ref 36.0–46.0)
Hemoglobin: 11 g/dL — ABNORMAL LOW (ref 12.0–15.0)
MCH: 29.3 pg (ref 26.0–34.0)
MCHC: 33 g/dL (ref 30.0–36.0)
MCV: 88.8 fL (ref 78.0–100.0)
Platelets: 222 10*3/uL (ref 150–400)
RBC: 3.75 MIL/uL — ABNORMAL LOW (ref 3.87–5.11)
RDW: 14 % (ref 11.5–15.5)
WBC: 11 10*3/uL — ABNORMAL HIGH (ref 4.0–10.5)

## 2016-09-04 LAB — TYPE AND SCREEN
ABO/RH(D): O POS
Antibody Screen: NEGATIVE

## 2016-09-04 LAB — OB RESULTS CONSOLE GBS: GBS: POSITIVE

## 2016-09-04 LAB — RPR: RPR Ser Ql: NONREACTIVE

## 2016-09-04 SURGERY — Surgical Case
Anesthesia: Epidural

## 2016-09-04 MED ORDER — SIMETHICONE 80 MG PO CHEW
80.0000 mg | CHEWABLE_TABLET | Freq: Three times a day (TID) | ORAL | Status: DC
  Administered 2016-09-04 – 2016-09-07 (×6): 80 mg via ORAL
  Filled 2016-09-04 (×7): qty 1

## 2016-09-04 MED ORDER — ONDANSETRON HCL 4 MG/2ML IJ SOLN
INTRAMUSCULAR | Status: AC
  Filled 2016-09-04: qty 2

## 2016-09-04 MED ORDER — ACETAMINOPHEN 500 MG PO TABS
1000.0000 mg | ORAL_TABLET | Freq: Four times a day (QID) | ORAL | Status: AC
  Administered 2016-09-04: 1000 mg via ORAL
  Filled 2016-09-04: qty 2

## 2016-09-04 MED ORDER — EPHEDRINE 5 MG/ML INJ: 10.0000 mg | INTRAVENOUS | Status: DC | PRN

## 2016-09-04 MED ORDER — PROPOFOL 10 MG/ML IV BOLUS
INTRAVENOUS | Status: AC
  Filled 2016-09-04: qty 20

## 2016-09-04 MED ORDER — SOD CITRATE-CITRIC ACID 500-334 MG/5ML PO SOLN
30.0000 mL | ORAL | Status: DC | PRN
  Filled 2016-09-04: qty 15

## 2016-09-04 MED ORDER — DEXTROSE IN LACTATED RINGERS 5 % IV SOLN: INTRAVENOUS | Status: DC

## 2016-09-04 MED ORDER — VANCOMYCIN HCL IN DEXTROSE 1-5 GM/200ML-% IV SOLN
1000.0000 mg | Freq: Two times a day (BID) | INTRAVENOUS | Status: DC
  Administered 2016-09-04: 1000 mg via INTRAVENOUS
  Filled 2016-09-04 (×2): qty 200

## 2016-09-04 MED ORDER — OXYCODONE-ACETAMINOPHEN 5-325 MG PO TABS
1.0000 | ORAL_TABLET | ORAL | Status: DC | PRN
  Administered 2016-09-05 (×3): 1 via ORAL
  Filled 2016-09-04 (×3): qty 1

## 2016-09-04 MED ORDER — CHLOROPROCAINE HCL (PF) 3 % IJ SOLN
INTRAMUSCULAR | Status: AC
  Filled 2016-09-04: qty 20

## 2016-09-04 MED ORDER — NALBUPHINE HCL 10 MG/ML IJ SOLN: 5.0000 mg | INTRAMUSCULAR | Status: DC | PRN

## 2016-09-04 MED ORDER — MEDROXYPROGESTERONE ACETATE 150 MG/ML IM SUSP: 150.0000 mg | INTRAMUSCULAR | Status: DC | PRN

## 2016-09-04 MED ORDER — SIMETHICONE 80 MG PO CHEW
80.0000 mg | CHEWABLE_TABLET | ORAL | Status: DC
  Administered 2016-09-05 – 2016-09-07 (×3): 80 mg via ORAL
  Filled 2016-09-04 (×3): qty 1

## 2016-09-04 MED ORDER — NALBUPHINE HCL 10 MG/ML IJ SOLN
5.0000 mg | Freq: Once | INTRAMUSCULAR | Status: DC | PRN
Start: 2016-09-04 — End: 2016-09-06

## 2016-09-04 MED ORDER — OXYCODONE-ACETAMINOPHEN 5-325 MG PO TABS: 2.0000 | ORAL_TABLET | ORAL | Status: DC | PRN

## 2016-09-04 MED ORDER — DIPHENHYDRAMINE HCL 25 MG PO CAPS: 25.0000 mg | ORAL_CAPSULE | ORAL | Status: DC | PRN

## 2016-09-04 MED ORDER — KETOROLAC TROMETHAMINE 30 MG/ML IJ SOLN: 30.0000 mg | Freq: Once | INTRAMUSCULAR | Status: DC | PRN

## 2016-09-04 MED ORDER — KETOROLAC TROMETHAMINE 30 MG/ML IJ SOLN
INTRAMUSCULAR | Status: AC
  Filled 2016-09-04: qty 1

## 2016-09-04 MED ORDER — ACETAMINOPHEN 325 MG PO TABS: 650.0000 mg | ORAL_TABLET | ORAL | Status: DC | PRN

## 2016-09-04 MED ORDER — OXYTOCIN 10 UNIT/ML IJ SOLN
INTRAMUSCULAR | Status: DC | PRN
  Administered 2016-09-04: 40 [IU] via INTRAVENOUS

## 2016-09-04 MED ORDER — LACTATED RINGERS IV SOLN: 500.0000 mL | INTRAVENOUS | Status: DC | PRN

## 2016-09-04 MED ORDER — MORPHINE SULFATE (PF) 0.5 MG/ML IJ SOLN
INTRAMUSCULAR | Status: AC
  Filled 2016-09-04: qty 10

## 2016-09-04 MED ORDER — MORPHINE SULFATE (PF) 0.5 MG/ML IJ SOLN
INTRAMUSCULAR | Status: DC | PRN
  Administered 2016-09-04: 2 mg via INTRAVENOUS
  Administered 2016-09-04: 3 mg via EPIDURAL

## 2016-09-04 MED ORDER — NALOXONE HCL 0.4 MG/ML IJ SOLN: 0.4000 mg | INTRAMUSCULAR | Status: DC | PRN

## 2016-09-04 MED ORDER — PROMETHAZINE HCL 25 MG/ML IJ SOLN: 6.2500 mg | INTRAMUSCULAR | Status: DC | PRN

## 2016-09-04 MED ORDER — PHENYLEPHRINE 40 MCG/ML (10ML) SYRINGE FOR IV PUSH (FOR BLOOD PRESSURE SUPPORT)
PREFILLED_SYRINGE | INTRAVENOUS | Status: AC
  Filled 2016-09-04: qty 10

## 2016-09-04 MED ORDER — ONDANSETRON HCL 4 MG/2ML IJ SOLN: 4.0000 mg | Freq: Three times a day (TID) | INTRAMUSCULAR | Status: DC | PRN

## 2016-09-04 MED ORDER — BUTORPHANOL TARTRATE 1 MG/ML IJ SOLN
1.0000 mg | INTRAMUSCULAR | Status: DC | PRN
  Administered 2016-09-04: 1 mg via INTRAVENOUS
  Filled 2016-09-04: qty 1

## 2016-09-04 MED ORDER — FENTANYL 2.5 MCG/ML BUPIVACAINE 1/10 % EPIDURAL INFUSION (WH - ANES)
INTRAMUSCULAR | Status: AC
  Filled 2016-09-04: qty 100

## 2016-09-04 MED ORDER — DIBUCAINE 1 % RE OINT: 1.0000 "application " | TOPICAL_OINTMENT | RECTAL | Status: DC | PRN

## 2016-09-04 MED ORDER — TETANUS-DIPHTH-ACELL PERTUSSIS 5-2.5-18.5 LF-MCG/0.5 IM SUSP: 0.5000 mL | Freq: Once | INTRAMUSCULAR | Status: DC

## 2016-09-04 MED ORDER — LACTATED RINGERS IV SOLN
500.0000 mL | Freq: Once | INTRAVENOUS | Status: AC
  Administered 2016-09-04: 1000 mL via INTRAVENOUS

## 2016-09-04 MED ORDER — LIDOCAINE HCL (PF) 1 % IJ SOLN: 30.0000 mL | INTRAMUSCULAR | Status: DC | PRN

## 2016-09-04 MED ORDER — SODIUM CHLORIDE 0.9% FLUSH: 3.0000 mL | INTRAVENOUS | Status: DC | PRN

## 2016-09-04 MED ORDER — NALBUPHINE HCL 10 MG/ML IJ SOLN: 5.0000 mg | Freq: Once | INTRAMUSCULAR | Status: DC | PRN

## 2016-09-04 MED ORDER — MENTHOL 3 MG MT LOZG: 1.0000 | LOZENGE | OROMUCOSAL | Status: DC | PRN

## 2016-09-04 MED ORDER — OXYTOCIN 40 UNITS IN LACTATED RINGERS INFUSION - SIMPLE MED: 2.5000 [IU]/h | INTRAVENOUS | Status: AC

## 2016-09-04 MED ORDER — SENNOSIDES-DOCUSATE SODIUM 8.6-50 MG PO TABS
2.0000 | ORAL_TABLET | ORAL | Status: DC
  Administered 2016-09-05 – 2016-09-07 (×3): 2 via ORAL
  Filled 2016-09-04 (×3): qty 2

## 2016-09-04 MED ORDER — SODIUM CHLORIDE 0.9 % IR SOLN
Status: DC | PRN
  Administered 2016-09-04: 800 mL

## 2016-09-04 MED ORDER — OXYCODONE-ACETAMINOPHEN 5-325 MG PO TABS
2.0000 | ORAL_TABLET | ORAL | Status: DC | PRN
  Administered 2016-09-05 – 2016-09-06 (×3): 2 via ORAL
  Filled 2016-09-04 (×3): qty 2

## 2016-09-04 MED ORDER — DIPHENHYDRAMINE HCL 25 MG PO CAPS: 25.0000 mg | ORAL_CAPSULE | Freq: Four times a day (QID) | ORAL | Status: DC | PRN

## 2016-09-04 MED ORDER — MIDAZOLAM HCL 2 MG/2ML IJ SOLN
INTRAMUSCULAR | Status: AC
  Filled 2016-09-04: qty 2

## 2016-09-04 MED ORDER — KETOROLAC TROMETHAMINE 30 MG/ML IJ SOLN: 30.0000 mg | Freq: Four times a day (QID) | INTRAMUSCULAR | Status: DC | PRN

## 2016-09-04 MED ORDER — WITCH HAZEL-GLYCERIN EX PADS: 1.0000 "application " | MEDICATED_PAD | CUTANEOUS | Status: DC | PRN

## 2016-09-04 MED ORDER — DIPHENHYDRAMINE HCL 50 MG/ML IJ SOLN: 12.5000 mg | INTRAMUSCULAR | Status: DC | PRN

## 2016-09-04 MED ORDER — LACTATED RINGERS IV SOLN
INTRAVENOUS | Status: DC | PRN
  Administered 2016-09-04 (×2): via INTRAVENOUS

## 2016-09-04 MED ORDER — HYDROMORPHONE HCL 1 MG/ML IJ SOLN: 0.2500 mg | INTRAMUSCULAR | Status: DC | PRN

## 2016-09-04 MED ORDER — PHENYLEPHRINE 40 MCG/ML (10ML) SYRINGE FOR IV PUSH (FOR BLOOD PRESSURE SUPPORT): 80.0000 ug | PREFILLED_SYRINGE | INTRAVENOUS | Status: DC | PRN

## 2016-09-04 MED ORDER — COCONUT OIL OIL
1.0000 "application " | TOPICAL_OIL | Status: DC | PRN
  Administered 2016-09-07: 1 via TOPICAL
  Filled 2016-09-04: qty 120

## 2016-09-04 MED ORDER — MISOPROSTOL 25 MCG QUARTER TABLET
25.0000 ug | ORAL_TABLET | ORAL | Status: DC
  Administered 2016-09-04: 25 ug via VAGINAL
  Filled 2016-09-04: qty 1

## 2016-09-04 MED ORDER — FENTANYL CITRATE (PF) 100 MCG/2ML IJ SOLN
INTRAMUSCULAR | Status: DC | PRN
  Administered 2016-09-04 (×2): 50 ug via INTRAVENOUS

## 2016-09-04 MED ORDER — ONDANSETRON HCL 4 MG/2ML IJ SOLN
INTRAMUSCULAR | Status: DC | PRN
  Administered 2016-09-04: 4 mg via INTRAVENOUS

## 2016-09-04 MED ORDER — NALOXONE HCL 2 MG/2ML IJ SOSY
1.0000 ug/kg/h | PREFILLED_SYRINGE | INTRAMUSCULAR | Status: DC | PRN
  Filled 2016-09-04: qty 2

## 2016-09-04 MED ORDER — PRENATAL MULTIVITAMIN CH
1.0000 | ORAL_TABLET | Freq: Every day | ORAL | Status: DC
  Administered 2016-09-05 – 2016-09-06 (×2): 1 via ORAL
  Filled 2016-09-04: qty 1

## 2016-09-04 MED ORDER — OXYCODONE-ACETAMINOPHEN 5-325 MG PO TABS: 1.0000 | ORAL_TABLET | ORAL | Status: DC | PRN

## 2016-09-04 MED ORDER — ONDANSETRON HCL 4 MG/2ML IJ SOLN
4.0000 mg | Freq: Four times a day (QID) | INTRAMUSCULAR | Status: DC | PRN
  Administered 2016-09-04: 4 mg via INTRAVENOUS
  Filled 2016-09-04: qty 2

## 2016-09-04 MED ORDER — LACTATED RINGERS IV SOLN
INTRAVENOUS | Status: DC
  Administered 2016-09-04: 125 mL/h via INTRAVENOUS
  Administered 2016-09-04 (×2): via INTRAVENOUS
  Administered 2016-09-04: 125 mL/h via INTRAVENOUS

## 2016-09-04 MED ORDER — SIMETHICONE 80 MG PO CHEW: 80.0000 mg | CHEWABLE_TABLET | ORAL | Status: DC | PRN

## 2016-09-04 MED ORDER — LACTATED RINGERS IV SOLN
INTRAVENOUS | Status: DC | PRN
  Administered 2016-09-04: 10:00:00 via INTRAVENOUS

## 2016-09-04 MED ORDER — LIDOCAINE HCL (PF) 1 % IJ SOLN
INTRAMUSCULAR | Status: DC | PRN
  Administered 2016-09-04: 7 mL via EPIDURAL
  Administered 2016-09-04: 6 mL via EPIDURAL

## 2016-09-04 MED ORDER — OXYTOCIN 40 UNITS IN LACTATED RINGERS INFUSION - SIMPLE MED: 2.5000 [IU]/h | INTRAVENOUS | Status: DC

## 2016-09-04 MED ORDER — SCOPOLAMINE 1 MG/3DAYS TD PT72
1.0000 | MEDICATED_PATCH | Freq: Once | TRANSDERMAL | Status: DC
Start: 2016-09-04 — End: 2016-09-07
  Filled 2016-09-04: qty 1

## 2016-09-04 MED ORDER — MEASLES, MUMPS & RUBELLA VAC ~~LOC~~ INJ
0.5000 mL | INJECTION | Freq: Once | SUBCUTANEOUS | Status: DC
  Filled 2016-09-04: qty 0.5

## 2016-09-04 MED ORDER — MEPERIDINE HCL 25 MG/ML IJ SOLN: 6.2500 mg | INTRAMUSCULAR | Status: DC | PRN

## 2016-09-04 MED ORDER — FENTANYL 2.5 MCG/ML BUPIVACAINE 1/10 % EPIDURAL INFUSION (WH - ANES)
14.0000 mL/h | INTRAMUSCULAR | Status: DC | PRN
  Administered 2016-09-04: 14 mL/h via EPIDURAL

## 2016-09-04 MED ORDER — FENTANYL CITRATE (PF) 100 MCG/2ML IJ SOLN
INTRAMUSCULAR | Status: AC
  Filled 2016-09-04: qty 2

## 2016-09-04 MED ORDER — MIDAZOLAM HCL 5 MG/5ML IJ SOLN
INTRAMUSCULAR | Status: DC | PRN
  Administered 2016-09-04 (×2): 1 mg via INTRAVENOUS

## 2016-09-04 MED ORDER — IBUPROFEN 600 MG PO TABS
600.0000 mg | ORAL_TABLET | Freq: Four times a day (QID) | ORAL | Status: DC
  Administered 2016-09-04 – 2016-09-07 (×9): 600 mg via ORAL
  Filled 2016-09-04 (×10): qty 1

## 2016-09-04 MED ORDER — TERBUTALINE SULFATE 1 MG/ML IJ SOLN
0.2500 mg | Freq: Once | INTRAMUSCULAR | Status: DC | PRN
  Filled 2016-09-04: qty 1

## 2016-09-04 MED ORDER — IBUPROFEN 600 MG PO TABS
600.0000 mg | ORAL_TABLET | Freq: Four times a day (QID) | ORAL | Status: DC | PRN
  Administered 2016-09-05: 600 mg via ORAL

## 2016-09-04 MED ORDER — SODIUM CHLORIDE 0.9 % IJ SOLN
INTRAMUSCULAR | Status: AC
  Filled 2016-09-04: qty 20

## 2016-09-04 MED ORDER — SODIUM BICARBONATE 8.4 % IV SOLN
INTRAVENOUS | Status: DC | PRN
  Administered 2016-09-04 (×2): 5 mL via EPIDURAL
  Administered 2016-09-04: 3 mL via EPIDURAL
  Administered 2016-09-04: 2 mL via EPIDURAL

## 2016-09-04 MED ORDER — KETOROLAC TROMETHAMINE 30 MG/ML IJ SOLN
30.0000 mg | Freq: Four times a day (QID) | INTRAMUSCULAR | Status: DC | PRN
  Administered 2016-09-04: 30 mg via INTRAMUSCULAR

## 2016-09-04 MED ORDER — OXYTOCIN 10 UNIT/ML IJ SOLN
INTRAMUSCULAR | Status: AC
  Filled 2016-09-04: qty 4

## 2016-09-04 MED ORDER — OXYTOCIN BOLUS FROM INFUSION: 500.0000 mL | Freq: Once | INTRAVENOUS | Status: DC

## 2016-09-04 SURGICAL SUPPLY — 31 items
BENZOIN TINCTURE PRP APPL 2/3 (GAUZE/BANDAGES/DRESSINGS) ×2 IMPLANT
CHLORAPREP W/TINT 26ML (MISCELLANEOUS) ×2 IMPLANT
CLAMP CORD UMBIL (MISCELLANEOUS) IMPLANT
CLOTH BEACON ORANGE TIMEOUT ST (SAFETY) ×2 IMPLANT
DERMABOND ADVANCED (GAUZE/BANDAGES/DRESSINGS) ×1
DERMABOND ADVANCED .7 DNX12 (GAUZE/BANDAGES/DRESSINGS) ×1 IMPLANT
DRSG OPSITE POSTOP 4X10 (GAUZE/BANDAGES/DRESSINGS) ×2 IMPLANT
DRSG OPSITE POSTOP 4X12 (GAUZE/BANDAGES/DRESSINGS) ×2 IMPLANT
ELECT REM PT RETURN 9FT ADLT (ELECTROSURGICAL) ×2
ELECTRODE REM PT RTRN 9FT ADLT (ELECTROSURGICAL) ×1 IMPLANT
EXTRACTOR VACUUM M CUP 4 TUBE (SUCTIONS) IMPLANT
GLOVE BIO SURGEON STRL SZ 6.5 (GLOVE) ×2 IMPLANT
GLOVE BIOGEL PI IND STRL 7.0 (GLOVE) ×2 IMPLANT
GLOVE BIOGEL PI INDICATOR 7.0 (GLOVE) ×2
GOWN STRL REUS W/TWL LRG LVL3 (GOWN DISPOSABLE) ×4 IMPLANT
KIT ABG SYR 3ML LUER SLIP (SYRINGE) IMPLANT
NEEDLE HYPO 25X5/8 SAFETYGLIDE (NEEDLE) IMPLANT
NS IRRIG 1000ML POUR BTL (IV SOLUTION) ×2 IMPLANT
PACK C SECTION WH (CUSTOM PROCEDURE TRAY) ×2 IMPLANT
PAD OB MATERNITY 4.3X12.25 (PERSONAL CARE ITEMS) ×2 IMPLANT
PENCIL SMOKE EVAC W/HOLSTER (ELECTROSURGICAL) ×2 IMPLANT
STRIP CLOSURE SKIN 1/2X4 (GAUZE/BANDAGES/DRESSINGS) ×4 IMPLANT
SUT CHROMIC 0 CT 802H (SUTURE) IMPLANT
SUT CHROMIC 0 CTX 36 (SUTURE) ×6 IMPLANT
SUT MON AB-0 CT1 36 (SUTURE) ×2 IMPLANT
SUT PDS AB 0 CTX 60 (SUTURE) ×2 IMPLANT
SUT PLAIN 0 NONE (SUTURE) IMPLANT
SUT VIC AB 4-0 KS 27 (SUTURE) IMPLANT
SYR BULB 3OZ (MISCELLANEOUS) ×2 IMPLANT
TOWEL OR 17X24 6PK STRL BLUE (TOWEL DISPOSABLE) ×2 IMPLANT
TRAY FOLEY BAG SILVER LF 14FR (SET/KITS/TRAYS/PACK) IMPLANT

## 2016-09-04 NOTE — Addendum Note (Signed)
Addendum  created 09/04/16 2116 by Graciela Husbands, CRNA   Sign clinical note

## 2016-09-04 NOTE — H&P (Signed)
Kristina Holt is a 21 y.o. female  G1 @ 40+4 presenting for postdates IOL.  Pregnancy uncomplicated.  s/p cytotec x 1 overnight.   OB History    Gravida Para Term Preterm AB Living   1             SAB TAB Ectopic Multiple Live Births                 Past Medical History:  Diagnosis Date  . Medical history non-contributory    Past Surgical History:  Procedure Laterality Date  . NO PAST SURGERIES     Family History: family history includes Cancer in her mother; Parkinson's disease in her maternal grandfather. Social History:  reports that she has never smoked. She has never used smokeless tobacco. She reports that she does not drink alcohol or use drugs.     Maternal Diabetes: No Genetic Screening: Declined Maternal Ultrasounds/Referrals: Normal Fetal Ultrasounds or other Referrals:  None Maternal Substance Abuse:  No Significant Maternal Medications:  None Significant Maternal Lab Results:  None Other Comments:  None  ROS History Dilation: 3.5 Effacement (%): 50 Station: -2 Exam by:: Lytle Michaels RN Blood pressure 122/67, pulse 64, temperature 97.5 F (36.4 C), temperature source Oral, resp. rate 20, height  (1.727 m), weight 220 lb (99.8 kg), last menstrual period 11/25/2015. Exam  FHT cat 1 w/ early decels, good variability Toco Q2-4 Physical Exam  Gen - uncomfortable w/ epidural Abd - gravid Ext - NT Prenatal labs: ABO, Rh: --/--/O POS (04/10 0104) Antibody: NEG (04/10 0104) Rubella: Nonimmune (10/13 0000) RPR: Nonreactive (10/13 0000)  HBsAg: Negative (10/13 0000)  HIV: Non-reactive (10/13 0000)  GBS: Positive (04/10 0100)   Assessment/Plan: Plan for epidural AROM when comfortable  Vancomycin for GBS +   Kristina Holt 09/04/2016, 8:08 AM

## 2016-09-04 NOTE — Op Note (Signed)
Cesarean Section Procedure Note   Kristina Holt  09/04/2016  Indications: Fetal Distress   Pre-operative Diagnosis: stat c/s for fetal intolerance.   Post-operative Diagnosis: Same   Surgeon: Surgeon(s) and Role:    * Zelphia Cairo, MD - Primary   Assistants: Odelia Gage, RNFA  Anesthesia: epidural   Procedure Details:  The patient was seen in the Holding Room. The risks, benefits, complications, treatment options, and expected outcomes were discussed with the patient. The patient concurred with the proposed plan, giving informed consent. identified as [name] and the procedure verified as C-Section Delivery.  FHT checked prior to prepping patient were >100bpm.   A Time Out was held and the above information confirmed.  After induction of anesthesia, the patient was draped and prepped in the usual sterile manner. A transverse was made and carried down through the subcutaneous tissue to the fascia. Fascial incision was made and extended transversely. The fascia was separated from the underlying rectus tissue superiorly and inferiorly. The peritoneum was identified and entered. Peritoneal incision was extended longitudinally. The utero-vesical peritoneal reflection was incised transversely and the bladder flap was bluntly freed from the lower uterine segment. A low transverse uterine incision was made. Delivered from cephalic presentation was a viable female infant. Umbilical cord noted in front of body and between legs.  No nuchal cord.  Cord ph was not sent the umbilical cord was clamped and cut cord blood was obtained for evaluation. The placenta was removed Intact and appeared normal. The uterine outline, tubes and ovaries appeared abnormal - small 2cm posterior fibroid}. The uterine incision was closed with running locked sutures of 0chromic gut.   Hemostasis was observed. Lavage was carried out until clear.  Peritoneum closed with 0 monocryl. The fascia was then reapproximated with  running sutures of 0PDS. The skin was closed with 4-0Vicryl.   Instrument, sponge, and needle counts were correct prior the abdominal closure and were correct at the conclusion of the case.    Findings:   Estimated Blood Loss: 500cc   Urine Output: clear, 400cc  Specimens: placenta  Complications: no complications  Disposition: PACU - hemodynamically stable.   Maternal Condition: stable   Baby condition / location:  Couplet care / Skin to Skin  Attending Attestation: I was present and scrubbed for the entire procedure.   Signed: Surgeon(s): Zelphia Cairo, MD

## 2016-09-04 NOTE — Progress Notes (Signed)
Pt with multiple prolonged decelerations, Provider notified. Provider reviewed stip. No orders at this time. Will continue to monitor.

## 2016-09-04 NOTE — Anesthesia Pain Management Evaluation Note (Signed)
  CRNA Pain Management Visit Note  Patient: Kristina Holt, 21 y.o., female  "Hello I am a member of the anesthesia team at [facility]. We have an anesthesia team available at all times to provide care throughout the hospital, including epidural management and anesthesia for C-section. I don't know your plan for the delivery whether it a natural birth, water birth, IV sedation, nitrous supplementation, doula or epidural, but we want to meet your pain goals."   1.Was your pain managed to your expectations on prior hospitalizations?   No prior hospitalizations  2.What is your expectation for pain management during this hospitalization?     Epidural  3.How can we help you reach that goal? Epidural when desired  Record the patient's initial score and the patient's pain goal.   Pain: 5  Pain Goal: 7 The [facility] wants you to be able to say your pain was always managed very well.  Kristina Holt 09/04/2016

## 2016-09-04 NOTE — Transfer of Care (Signed)
Immediate Anesthesia Transfer of Care Note  Patient: Kristina Holt  Procedure(s) Performed: Procedure(s): CESAREAN SECTION (N/A)  Patient Location: PACU  Anesthesia Type:Epidural  Level of Consciousness: sedated  Airway & Oxygen Therapy: Patient Spontanous Breathing  Post-op Assessment: Report given to RN  Post vital signs: Reviewed and stable  Last Vitals:  Vitals:   09/04/16 0950 09/04/16 0951  BP:  128/80  Pulse: 64 (!) 57  Resp:  20  Temp:      Last Pain:  Vitals:   09/04/16 0925  TempSrc:   PainSc: 8       Patients Stated Pain Goal: 4 (09/04/16 0732)  Complications: No apparent anesthesia complications

## 2016-09-04 NOTE — Anesthesia Postprocedure Evaluation (Signed)
Anesthesia Post Note  Patient: Kristina Holt  Procedure(s) Performed: Procedure(s) (LRB): CESAREAN SECTION (N/A)  Patient location during evaluation: PACU Anesthesia Type: Epidural Level of consciousness: oriented and awake and alert Pain management: pain level controlled Vital Signs Assessment: post-procedure vital signs reviewed and stable Respiratory status: spontaneous breathing, respiratory function stable and nonlabored ventilation Cardiovascular status: blood pressure returned to baseline and stable Postop Assessment: no headache and no backache Anesthetic complications: no        Last Vitals:  Vitals:   09/04/16 1225 09/04/16 1230  BP:  113/60  Pulse: 72 79  Resp: 16 (!) 23  Temp:      Last Pain:  Vitals:   09/04/16 1230  TempSrc:   PainSc: 0-No pain   Pain Goal: Patients Stated Pain Goal: 4 (09/04/16 0732)               Antoinette Haskett A.

## 2016-09-04 NOTE — Progress Notes (Signed)
Pt was comfortable w/ epidural & I recommended AROM.  Approx 1 min after AROM, FHT dropped to 60s.  Despite position change, O2 and phenylephrine, FHT remained < 100.  Upon exam cervix was 4.5/C/-1 and vertex.  FSE placed but FHT did not improve. I rec primary c-section.  Pt and pts mom agreed with plan of care.  Pt taken emergently to [facility] for c-section.

## 2016-09-04 NOTE — Anesthesia Preprocedure Evaluation (Addendum)
Anesthesia Evaluation  Patient identified by MRN, date of birth, ID band Patient awake    Reviewed: Allergy & Precautions, H&P , NPO status , Patient's Chart, lab work & pertinent test results  Airway Mallampati: II  TM Distance: >3 FB Neck ROM: full    Dental no notable dental hx.    Pulmonary neg pulmonary ROS,    Pulmonary exam normal        Cardiovascular negative cardio ROS Normal cardiovascular exam     Neuro/Psych negative neurological ROS  negative psych ROS   GI/Hepatic negative GI ROS, Neg liver ROS,   Endo/Other  negative endocrine ROS  Renal/GU negative Renal ROS     Musculoskeletal negative musculoskeletal ROS (+)   Abdominal (+) + obese,   Peds  Hematology negative hematology ROS (+)   Anesthesia Other Findings   Reproductive/Obstetrics (+) Pregnancy                             Anesthesia Physical Anesthesia Plan  ASA: II  Anesthesia Plan: Epidural   Post-op Pain Management:    Induction:   Airway Management Planned:   Additional Equipment:   Intra-op Plan:   Post-operative Plan:   Informed Consent: I have reviewed the patients History and Physical, chart, labs and discussed the procedure including the risks, benefits and alternatives for the proposed anesthesia with the patient or authorized representative who has indicated his/her understanding and acceptance.     Plan Discussed with: CRNA and Surgeon  Anesthesia Plan Comments: (For stat csection for fetal bradycardia with labor epidural in -situ)      Anesthesia Quick Evaluation

## 2016-09-04 NOTE — Anesthesia Procedure Notes (Addendum)
Epidural Patient location during procedure: OB Start time: 09/04/2016 8:25 AM End time: 09/04/2016 8:36 AM  Staffing Anesthesiologist: Leilani Able Performed: anesthesiologist   Preanesthetic Checklist Completed: patient identified, surgical consent, pre-op evaluation, timeout performed, IV checked, risks and benefits discussed and monitors and equipment checked  Epidural Patient position: sitting Prep: site prepped and draped and DuraPrep Patient monitoring: continuous pulse ox and blood pressure Approach: midline Location: L3-L4 Injection technique: LOR air  Needle:  Needle type: Tuohy  Needle gauge: 17 G Needle length: 9 cm and 9 Needle insertion depth: 8 cm Catheter type: closed end flexible Catheter size: 19 Gauge Catheter at skin depth: 13 cm Test dose: negative and Other  Assessment Sensory level: T9 Events: blood not aspirated, injection not painful, no injection resistance, negative IV test and no paresthesia  Additional Notes Reason for block:procedure for pain

## 2016-09-04 NOTE — Anesthesia Postprocedure Evaluation (Signed)
Anesthesia Post Note  Patient: Katasha Riga  Procedure(s) Performed: Procedure(s) (LRB): CESAREAN SECTION (N/A)  Patient location during evaluation: Mother Baby Anesthesia Type: Epidural Level of consciousness: awake and alert Pain management: satisfactory to patient Vital Signs Assessment: post-procedure vital signs reviewed and stable Respiratory status: respiratory function stable Cardiovascular status: stable Postop Assessment: no headache, no backache, epidural receding, patient able to bend at knees, no signs of nausea or vomiting and adequate PO intake Anesthetic complications: no        Last Vitals:  Vitals:   09/04/16 1830 09/04/16 1950  BP:  123/74  Pulse: 74 81  Resp: 18 18  Temp: 37.2 C 36.9 C    Last Pain:  Vitals:   09/04/16 1950  TempSrc: Oral  PainSc: 5    Pain Goal: Patients Stated Pain Goal: 4 (09/04/16 0732)               Karleen Dolphin

## 2016-09-05 ENCOUNTER — Encounter (HOSPITAL_COMMUNITY): Payer: Self-pay | Admitting: Obstetrics and Gynecology

## 2016-09-05 LAB — CBC
HCT: 26.8 % — ABNORMAL LOW (ref 36.0–46.0)
Hemoglobin: 9.1 g/dL — ABNORMAL LOW (ref 12.0–15.0)
MCH: 30.2 pg (ref 26.0–34.0)
MCHC: 34 g/dL (ref 30.0–36.0)
MCV: 89 fL (ref 78.0–100.0)
Platelets: 165 10*3/uL (ref 150–400)
RBC: 3.01 MIL/uL — ABNORMAL LOW (ref 3.87–5.11)
RDW: 14.2 % (ref 11.5–15.5)
WBC: 11.6 10*3/uL — ABNORMAL HIGH (ref 4.0–10.5)

## 2016-09-05 NOTE — Progress Notes (Signed)
Subjective: Postpartum Day 1: Cesarean Delivery Patient reports incisional pain, tolerating PO, + flatus and no problems voiding.    Objective: Vital signs in last 24 hours: Temp:  [97.8 F (36.6 C)-98.9 F (37.2 C)] 98.2 F (36.8 C) (04/11 0409) Pulse Rate:  [57-103] 95 (04/11 0409) Resp:  [14-28] 18 (04/11 0409) BP: (99-155)/(48-116) 108/49 (04/11 0409) SpO2:  [92 %-100 %] 98 % (04/11 0409)  Physical Exam:  General: alert and cooperative Lochia: appropriate Uterine Fundus: firm Incision: healing well DVT Evaluation: No evidence of DVT seen on physical exam. Negative Homan's sign. No cords or calf tenderness. No significant calf/ankle edema.   Recent Labs  09/04/16 0104 09/05/16 0514  HGB 11.0* 9.1*  HCT 33.3* 26.8*    Assessment/Plan: Status post Cesarean section. Doing well postoperatively.  Continue current care  desires circ prior to discharge.  Staysha Truby G 09/05/2016, 8:05 AM

## 2016-09-06 LAB — CBC
HCT: 27.3 % — ABNORMAL LOW (ref 36.0–46.0)
Hemoglobin: 9.2 g/dL — ABNORMAL LOW (ref 12.0–15.0)
MCH: 30.1 pg (ref 26.0–34.0)
MCHC: 33.7 g/dL (ref 30.0–36.0)
MCV: 89.2 fL (ref 78.0–100.0)
Platelets: 165 10*3/uL (ref 150–400)
RBC: 3.06 MIL/uL — ABNORMAL LOW (ref 3.87–5.11)
RDW: 14.2 % (ref 11.5–15.5)
WBC: 11.1 10*3/uL — ABNORMAL HIGH (ref 4.0–10.5)

## 2016-09-06 MED ORDER — LIDOCAINE 5 % EX PTCH
1.0000 | MEDICATED_PATCH | CUTANEOUS | Status: DC
  Administered 2016-09-06 – 2016-09-07 (×2): 1 via TRANSDERMAL
  Filled 2016-09-06 (×2): qty 1

## 2016-09-06 MED ORDER — HYDROMORPHONE HCL 2 MG PO TABS
2.0000 mg | ORAL_TABLET | ORAL | Status: DC | PRN
  Filled 2016-09-06: qty 1

## 2016-09-06 NOTE — Progress Notes (Signed)
Subjective: Postpartum Day 2: Cesarean Delivery Patient reports incisional pain, tolerating PO, + flatus and no problems voiding.    Objective: Vital signs in last 24 hours: Temp:  [98 F (36.7 C)-98.6 F (37 C)] 98 F (36.7 C) (04/12 0547) Pulse Rate:  [77-87] 77 (04/12 0547) Resp:  [16-20] 16 (04/12 0547) BP: (107-122)/(53-62) 111/62 (04/12 0547)  Physical Exam:  General: cooperative and fatigued Lochia: appropriate Uterine Fundus: firm Incision: small old drainage noted on honeycomb dressing DVT Evaluation: No evidence of DVT seen on physical exam. Negative Homan's sign. No cords or calf tenderness. No significant calf/ankle edema.   Recent Labs  09/04/16 0104 09/05/16 0514  HGB 11.0* 9.1*  HCT 33.3* 26.8*    Assessment/Plan: Status post Cesarean section. Postoperative course complicated by incisional pain  Change pain meds to dilaudid  cbc.  Kristina Holt G 09/06/2016, 8:24 AM

## 2016-09-07 MED ORDER — HYDROMORPHONE HCL 2 MG PO TABS: 2.0000 mg | ORAL_TABLET | ORAL | 0 refills | Status: DC | PRN

## 2016-09-07 MED ORDER — LIDOCAINE 5 % EX PTCH: 1.0000 | MEDICATED_PATCH | CUTANEOUS | 0 refills | Status: DC

## 2016-09-07 MED ORDER — IBUPROFEN 600 MG PO TABS: 600.0000 mg | ORAL_TABLET | Freq: Four times a day (QID) | ORAL | 1 refills | Status: DC | PRN

## 2016-09-07 NOTE — Progress Notes (Signed)
Subjective: Postpartum Day 3: Cesarean Delivery Patient reports tolerating PO, + flatus and no problems voiding.   Reports incisional pain improved Objective: Vital signs in last 24 hours: Temp:  [98.1 F (36.7 C)-98.2 F (36.8 C)] 98.1 F (36.7 C) (04/13 0622) Pulse Rate:  [77-98] 77 (04/13 0622) Resp:  [18-19] 18 (04/13 0622) BP: (114-117)/(68-72) 117/68 (04/13 0622) SpO2:  [99 %] 99 % (04/12 1914)  Physical Exam:  General: alert and cooperative Lochia: appropriate Uterine Fundus: firm Incision: healing well DVT Evaluation: No evidence of DVT seen on physical exam. Negative Homan's sign. No cords or calf tenderness. No significant calf/ankle edema.   Recent Labs  09/05/16 0514 09/06/16 0842  HGB 9.1* 9.2*  HCT 26.8* 27.3*    Assessment/Plan: Status post Cesarean section. Doing well postoperatively.  Discharge home with standard precautions and return to clinic in 1-2 weeks.  Lekendrick Alpern G 09/07/2016, 9:00 AM

## 2016-09-07 NOTE — Discharge Summary (Signed)
Obstetric Discharge Summary Reason for Admission: induction of labor Prenatal Procedures: ultrasound Intrapartum Procedures: cesarean: low cervical, transverse Postpartum Procedures: none Complications-Operative and Postpartum: none Hemoglobin  Date Value Ref Range Status  09/06/2016 9.2 (L) 12.0 - 15.0 Holt/dL Final   HCT  Date Value Ref Range Status  09/06/2016 27.3 (L) 36.0 - 46.0 % Final    Physical Exam:  General: alert and cooperative Lochia: appropriate Uterine Fundus: firm Incision: healing well DVT Evaluation: No evidence of DVT seen on physical exam. Negative Homan's sign. No cords or calf tenderness. Calf/Ankle edema is present.  Discharge Diagnoses: Term Pregnancy-delivered  Discharge Information: Date: 09/07/2016 Activity: pelvic rest Diet: routine Medications: PNV and Dilaudid and lidocaine patch Condition: stable Instructions: refer to practice specific booklet Discharge to: home   Newborn Data: Live born female  Birth Weight: 7 lb 10.9 oz ([id number] Holt) APGAR: 9, 9  Home with mother.  Kristina Holt 09/07/2016, 9:08 AM

## 2017-09-16 ENCOUNTER — Other Ambulatory Visit (HOSPITAL_COMMUNITY): Payer: Self-pay | Admitting: Orthopedic Surgery

## 2017-09-16 DIAGNOSIS — M2392 Unspecified internal derangement of left knee: Secondary | ICD-10-CM

## 2017-09-19 ENCOUNTER — Ambulatory Visit (HOSPITAL_COMMUNITY): Admission: RE | Admit: 2017-09-19 | Payer: BLUE CROSS/BLUE SHIELD | Source: Ambulatory Visit

## 2017-09-27 ENCOUNTER — Ambulatory Visit (HOSPITAL_COMMUNITY): Admission: RE | Admit: 2017-09-27 | Payer: BLUE CROSS/BLUE SHIELD | Source: Ambulatory Visit

## 2018-07-24 ENCOUNTER — Emergency Department (HOSPITAL_COMMUNITY)
Admission: EM | Admit: 2018-07-24 | Discharge: 2018-07-24 | Disposition: A | Discharge status: Home / Self Care | Payer: BLUE CROSS/BLUE SHIELD | Attending: Emergency Medicine | Admitting: Emergency Medicine

## 2018-07-24 ENCOUNTER — Encounter (HOSPITAL_COMMUNITY): Payer: Self-pay | Admitting: Emergency Medicine

## 2018-07-24 DIAGNOSIS — Z5321 Procedure and treatment not carried out due to patient leaving prior to being seen by health care provider: Secondary | ICD-10-CM | POA: Diagnosis not present

## 2018-07-24 DIAGNOSIS — R05 Cough: Secondary | ICD-10-CM | POA: Diagnosis not present

## 2018-07-24 NOTE — ED Triage Notes (Signed)
Pt states she has had nasal congestion and a cough for a few days. Pt also complains of headache. Denies fever and bodyaches

## 2018-07-24 NOTE — ED Notes (Signed)
Pt stated she wanted to leave prior to being seen by provider.  Pt encouraged to stay but declined.  Pt in no distress and ambulatory to the exit.

## 2018-09-30 IMAGING — US US MFM FETAL BPP W/O NON-STRESS
1 series · 8 of 8 positions shown · non-contrast
Comparison: none

[Series 1: us mfm fetal bpp w/o non-stress · 8 acquisitions, 8 frames shown]
[im 1/8]
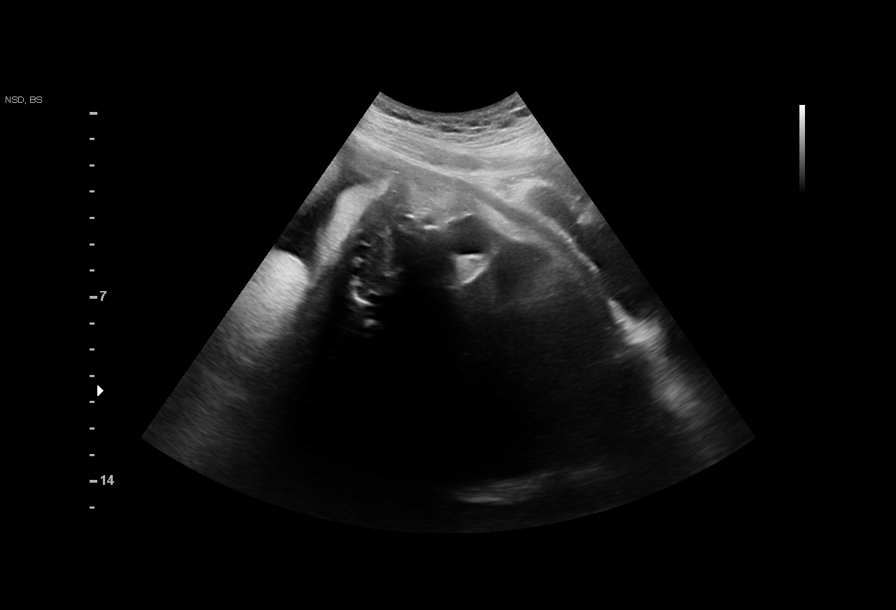
[im 2/8]
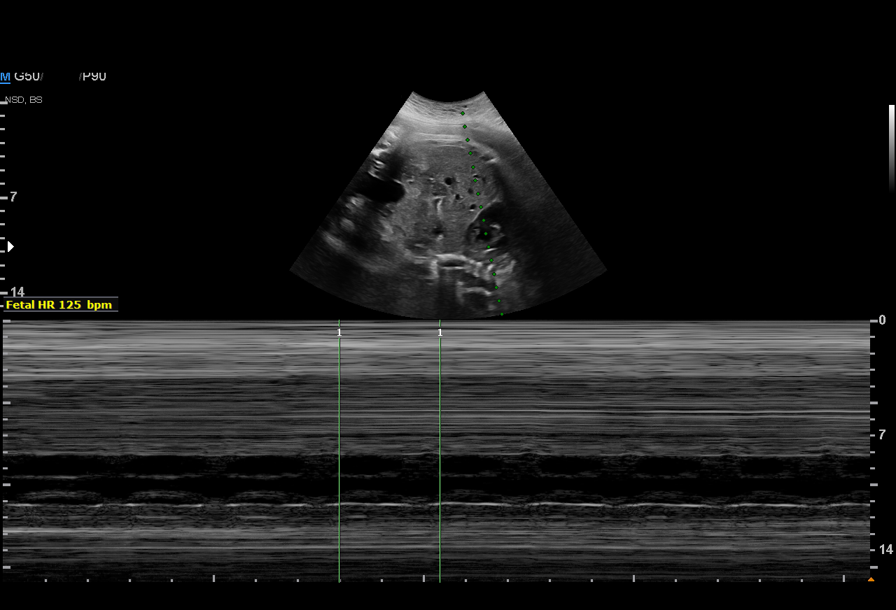
[im 3/8]
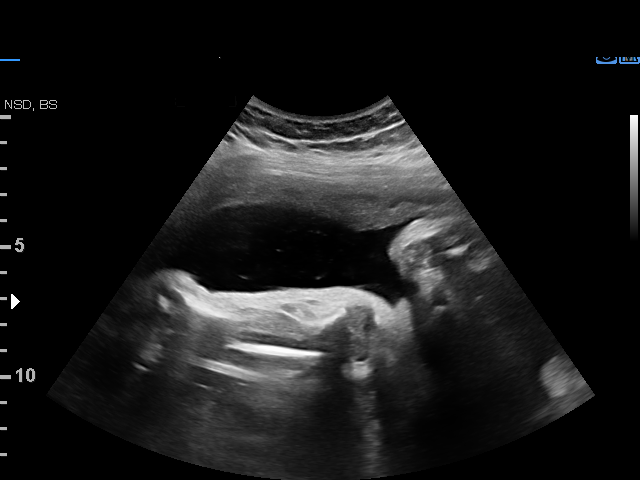
[im 4/8]
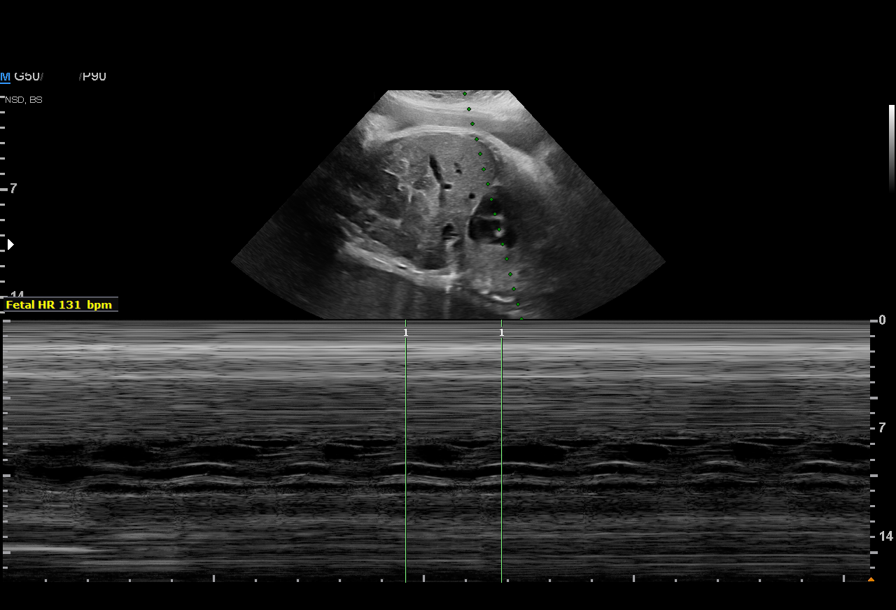
[im 5/8]
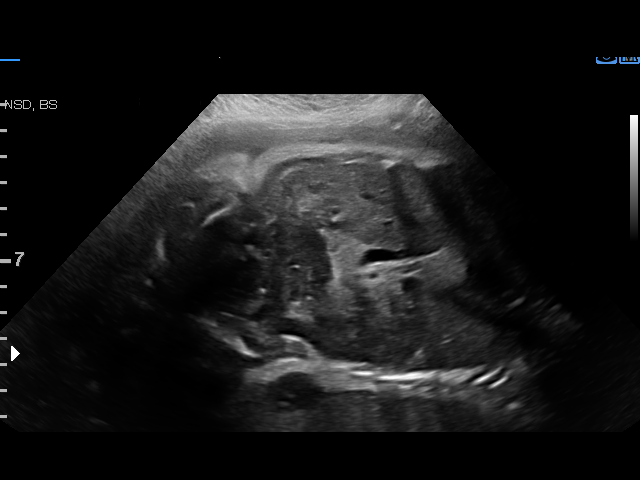
[im 6/8]
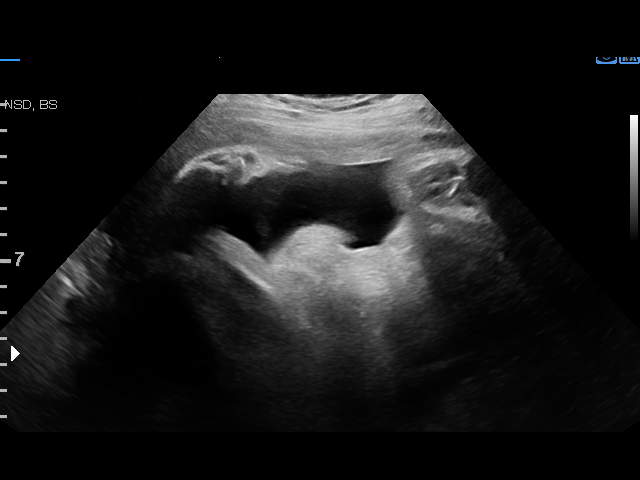
[im 7/8]
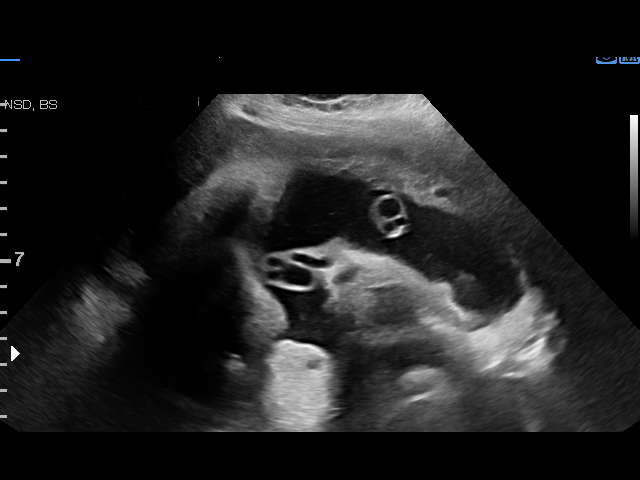
[im 8/8]
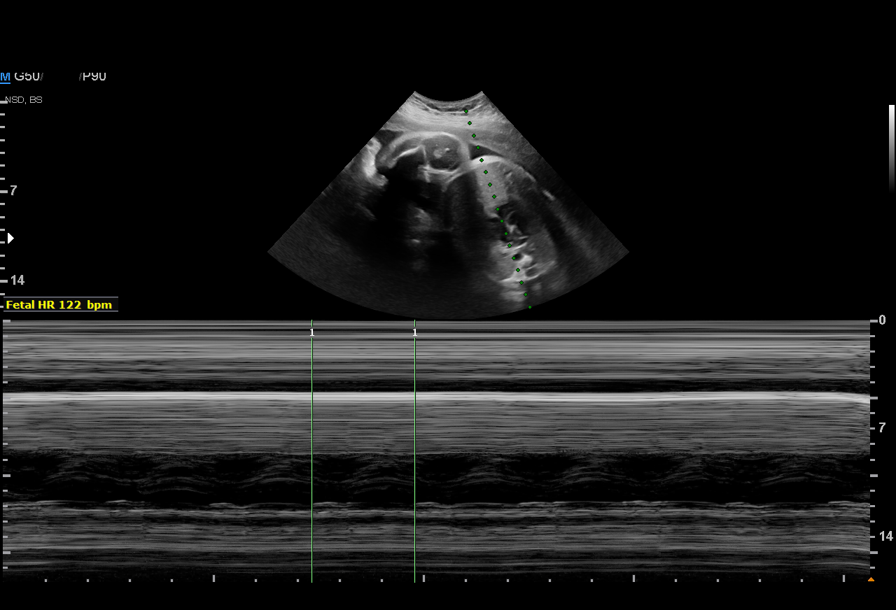

[8 of 8 positions shown; findings below may reference images not displayed]

[facility]
Attending:        Chai Tiger         Secondary Phy.:    3rd Nursing- 3rd
floor [phone]

1  MINDI DURANGO              [phone]      [phone]     [phone]
Indications

36 weeks gestation of pregnancy
Fetal heart rate decelerations affecting       O76
management of mother
OB History

Gravidity:    1         Term:   0        Prem:   0        SAB:   0
TOP:          0       Ectopic:  0        Living: 0
Fetal Evaluation

Num Of Fetuses:     1
Fetal Heart         131
Rate(bpm):
Cardiac Activity:   Observed
Presentation:       Cephalic

Amniotic Fluid
AFI FV:      Subjectively within normal limits

AFI Sum(cm)     %Tile       Largest Pocket(cm)
16.56           61

RUQ(cm)       RLQ(cm)       LUQ(cm)        LLQ(cm)
5.07
Biophysical Evaluation

Amniotic F.V:   Pocket => 2 cm two         F. Tone:         Observed
planes
F. Movement:    Observed                   Score:           [DATE]
F. Breathing:   Observed
Gestational Age

LMP:           36w 1d        Date:  12/02/15                 EDD:   09/07/16
Best:          36w 1d     Det. By:  LMP  (12/02/15)          EDD:   09/07/16
Impression

Single living intrauterine pregnancy at 55w3d.
Normal amniotic fluid volume.
BPP [DATE].
Recommendations

Follow up as clinically indicated.

## 2018-11-25 ENCOUNTER — Other Ambulatory Visit: Payer: Self-pay

## 2018-11-25 ENCOUNTER — Emergency Department (HOSPITAL_COMMUNITY): Payer: BC Managed Care – PPO

## 2018-11-25 ENCOUNTER — Emergency Department (HOSPITAL_COMMUNITY)
Admission: EM | Admit: 2018-11-25 | Discharge: 2018-11-25 | Disposition: A | Discharge status: Home / Self Care | Payer: BC Managed Care – PPO | Attending: Emergency Medicine | Admitting: Emergency Medicine

## 2018-11-25 ENCOUNTER — Encounter (HOSPITAL_COMMUNITY): Payer: Self-pay

## 2018-11-25 DIAGNOSIS — Z23 Encounter for immunization: Secondary | ICD-10-CM | POA: Insufficient documentation

## 2018-11-25 DIAGNOSIS — Y999 Unspecified external cause status: Secondary | ICD-10-CM | POA: Diagnosis not present

## 2018-11-25 DIAGNOSIS — Y9389 Activity, other specified: Secondary | ICD-10-CM | POA: Insufficient documentation

## 2018-11-25 DIAGNOSIS — S60222A Contusion of left hand, initial encounter: Secondary | ICD-10-CM | POA: Insufficient documentation

## 2018-11-25 DIAGNOSIS — S161XXA Strain of muscle, fascia and tendon at neck level, initial encounter: Secondary | ICD-10-CM | POA: Insufficient documentation

## 2018-11-25 DIAGNOSIS — S01511A Laceration without foreign body of lip, initial encounter: Secondary | ICD-10-CM | POA: Insufficient documentation

## 2018-11-25 DIAGNOSIS — S60221A Contusion of right hand, initial encounter: Secondary | ICD-10-CM | POA: Diagnosis not present

## 2018-11-25 DIAGNOSIS — R410 Disorientation, unspecified: Secondary | ICD-10-CM | POA: Insufficient documentation

## 2018-11-25 DIAGNOSIS — Y92410 Unspecified street and highway as the place of occurrence of the external cause: Secondary | ICD-10-CM | POA: Insufficient documentation

## 2018-11-25 DIAGNOSIS — F17210 Nicotine dependence, cigarettes, uncomplicated: Secondary | ICD-10-CM | POA: Diagnosis not present

## 2018-11-25 DIAGNOSIS — S0181XA Laceration without foreign body of other part of head, initial encounter: Secondary | ICD-10-CM | POA: Insufficient documentation

## 2018-11-25 DIAGNOSIS — M545 Low back pain, unspecified: Secondary | ICD-10-CM

## 2018-11-25 LAB — SAMPLE TO BLOOD BANK

## 2018-11-25 LAB — COMPREHENSIVE METABOLIC PANEL
ALT: 17 U/L (ref 0–44)
AST: 23 U/L (ref 15–41)
Albumin: 4.1 g/dL (ref 3.5–5.0)
Alkaline Phosphatase: 53 U/L (ref 38–126)
Anion gap: 10 (ref 5–15)
BUN: 13 mg/dL (ref 6–20)
CO2: 21 mmol/L — ABNORMAL LOW (ref 22–32)
Calcium: 9.2 mg/dL (ref 8.9–10.3)
Chloride: 108 mmol/L (ref 98–111)
Creatinine, Ser: 0.89 mg/dL (ref 0.44–1.00)
GFR calc Af Amer: >60 mL/min (ref >60)
GFR calc non Af Amer: >60 mL/min (ref >60)
Glucose, Bld: 104 mg/dL — ABNORMAL HIGH (ref 70–99)
Potassium: 3.9 mmol/L (ref 3.5–5.1)
Sodium: 139 mmol/L (ref 135–145)
Total Bilirubin: 0.6 mg/dL (ref 0.3–1.2)
Total Protein: 7.1 g/dL (ref 6.5–8.1)

## 2018-11-25 LAB — I-STAT CHEM 8, ED
BUN: 16 mg/dL (ref 6–20)
Calcium, Ion: 1.12 mmol/L — ABNORMAL LOW (ref 1.15–1.40)
Chloride: 107 mmol/L (ref 98–111)
Creatinine, Ser: 0.8 mg/dL (ref 0.44–1.00)
Glucose, Bld: 101 mg/dL — ABNORMAL HIGH (ref 70–99)
HCT: 40 % (ref 36.0–46.0)
Hemoglobin: 13.6 g/dL (ref 12.0–15.0)
Potassium: 3.7 mmol/L (ref 3.5–5.1)
Sodium: 141 mmol/L (ref 135–145)
TCO2: 21 mmol/L — ABNORMAL LOW (ref 22–32)

## 2018-11-25 LAB — CDS SEROLOGY

## 2018-11-25 LAB — CBC
HCT: 41.7 % (ref 36.0–46.0)
Hemoglobin: 13.4 g/dL (ref 12.0–15.0)
MCH: 28.8 pg (ref 26.0–34.0)
MCHC: 32.1 g/dL (ref 30.0–36.0)
MCV: 89.5 fL (ref 80.0–100.0)
Platelets: 268 10*3/uL (ref 150–400)
RBC: 4.66 MIL/uL (ref 3.87–5.11)
RDW: 12.4 % (ref 11.5–15.5)
WBC: 14.8 10*3/uL — ABNORMAL HIGH (ref 4.0–10.5)
nRBC: 0 % (ref 0.0–0.2)

## 2018-11-25 LAB — PROTIME-INR
INR: 1.1 (ref 0.8–1.2)
Prothrombin Time: 13.6 seconds (ref 11.4–15.2)

## 2018-11-25 LAB — I-STAT BETA HCG BLOOD, ED (MC, WL, AP ONLY): I-stat hCG, quantitative: <5 m[IU]/mL (ref <5)

## 2018-11-25 LAB — LACTIC ACID, PLASMA: Lactic Acid, Venous: 2.5 mmol/L (ref 0.5–1.9)

## 2018-11-25 MED ORDER — ACETAMINOPHEN 325 MG PO TABS
650.0000 mg | ORAL_TABLET | Freq: Once | ORAL | Status: AC
  Administered 2018-11-25: 650 mg via ORAL
  Filled 2018-11-25: qty 2

## 2018-11-25 MED ORDER — IOHEXOL 300 MG/ML  SOLN
100.0000 mL | Freq: Once | INTRAMUSCULAR | Status: AC | PRN
  Administered 2018-11-25: 100 mL via INTRAVENOUS

## 2018-11-25 MED ORDER — TETANUS-DIPHTH-ACELL PERTUSSIS 5-2.5-18.5 LF-MCG/0.5 IM SUSP
0.5000 mL | Freq: Once | INTRAMUSCULAR | Status: AC
  Administered 2018-11-25: 0.5 mL via INTRAMUSCULAR
  Filled 2018-11-25: qty 0.5

## 2018-11-25 MED ORDER — IBUPROFEN 800 MG PO TABS: 800.0000 mg | ORAL_TABLET | Freq: Three times a day (TID) | ORAL | 0 refills | Status: DC | PRN

## 2018-11-25 MED ORDER — METHOCARBAMOL 500 MG PO TABS: 500.0000 mg | ORAL_TABLET | Freq: Three times a day (TID) | ORAL | 0 refills | Status: DC | PRN

## 2018-11-25 MED ORDER — ONDANSETRON HCL 4 MG/2ML IJ SOLN
4.0000 mg | Freq: Once | INTRAMUSCULAR | Status: AC
  Administered 2018-11-25: 4 mg via INTRAVENOUS
  Filled 2018-11-25: qty 2

## 2018-11-25 NOTE — ED Notes (Signed)
Pt ambulates to bathroom with moderate assist.

## 2018-11-25 NOTE — ED Notes (Signed)
Pt
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
## 2018-11-25 NOTE — ED Notes (Addendum)
Pt returns from ct. Requesting pain  Meds for headache. Pt c/o pain at bilateral hands. Dr. Melina Copa aware.
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
## 2018-11-25 NOTE — ED Notes (Signed)
Wounds cleaned. Laceration noted below lip at midline. Appears to be through from teeth. No bleeding at present. Will inform provider.

## 2018-11-25 NOTE — ED Notes (Signed)
Pt refusing iv or blood work.

## 2018-11-25 NOTE — ED Notes (Signed)
Pt states unable to provide urine.

## 2018-11-25 NOTE — ED Notes (Addendum)
Dressing applied at left wrist and arm to 2 silver dollar size skin abrasions.Marland Kitchen abrasionat shins cleaned and left open. Minor abrasion to both hands cleaned.Back noted as clear with some tenderness to right lower back.
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
## 2018-11-25 NOTE — ED Notes (Signed)
Warm blankets applied

## 2018-11-25 NOTE — ED Notes (Signed)
Orson Slick [phone] (friend/roommate) [place] (brother) [phone]  Please call for updates

## 2018-11-25 NOTE — ED Notes (Signed)
Pt speaking on phone with family at length. Alert and oriented x 3. State headache much improv ed.

## 2018-11-25 NOTE — ED Notes (Signed)
Dr. Melina Copa aware of lactic.

## 2018-11-26 ENCOUNTER — Encounter (HOSPITAL_COMMUNITY): Payer: Self-pay | Admitting: Emergency Medicine

## 2018-12-02 ENCOUNTER — Other Ambulatory Visit: Payer: Self-pay | Admitting: *Deleted

## 2018-12-02 DIAGNOSIS — Z20822 Contact with and (suspected) exposure to covid-19: Secondary | ICD-10-CM

## 2018-12-02 NOTE — Progress Notes (Signed)
l aa

## 2018-12-08 LAB — NOVEL CORONAVIRUS, NAA: SARS-CoV-2, NAA: NOT DETECTED

## 2019-09-14 ENCOUNTER — Encounter (HOSPITAL_COMMUNITY): Payer: Self-pay

## 2019-09-14 ENCOUNTER — Other Ambulatory Visit: Payer: Self-pay

## 2019-09-14 DIAGNOSIS — Z5321 Procedure and treatment not carried out due to patient leaving prior to being seen by health care provider: Secondary | ICD-10-CM | POA: Insufficient documentation

## 2019-09-14 DIAGNOSIS — R1110 Vomiting, unspecified: Secondary | ICD-10-CM | POA: Insufficient documentation

## 2019-09-14 LAB — COMPREHENSIVE METABOLIC PANEL
ALT: 10 U/L (ref 0–44)
AST: 13 U/L — ABNORMAL LOW (ref 15–41)
Albumin: 4.5 g/dL (ref 3.5–5.0)
Alkaline Phosphatase: 56 U/L (ref 38–126)
Anion gap: 13 (ref 5–15)
BUN: 14 mg/dL (ref 6–20)
CO2: 22 mmol/L (ref 22–32)
Calcium: 9.3 mg/dL (ref 8.9–10.3)
Chloride: 108 mmol/L (ref 98–111)
Creatinine, Ser: 0.97 mg/dL (ref 0.44–1.00)
GFR calc Af Amer: >60 mL/min (ref >60)
GFR calc non Af Amer: >60 mL/min (ref >60)
Glucose, Bld: 121 mg/dL — ABNORMAL HIGH (ref 70–99)
Potassium: 3.8 mmol/L (ref 3.5–5.1)
Sodium: 143 mmol/L (ref 135–145)
Total Bilirubin: 1 mg/dL (ref 0.3–1.2)
Total Protein: 7.9 g/dL (ref 6.5–8.1)

## 2019-09-14 LAB — CBC
HCT: 42.9 % (ref 36.0–46.0)
Hemoglobin: 13.5 g/dL (ref 12.0–15.0)
MCH: 29 pg (ref 26.0–34.0)
MCHC: 31.5 g/dL (ref 30.0–36.0)
MCV: 92.3 fL (ref 80.0–100.0)
Platelets: 314 10*3/uL (ref 150–400)
RBC: 4.65 MIL/uL (ref 3.87–5.11)
RDW: 13.5 % (ref 11.5–15.5)
WBC: 16.4 10*3/uL — ABNORMAL HIGH (ref 4.0–10.5)
nRBC: 0 % (ref 0.0–0.2)

## 2019-09-14 LAB — LIPASE, BLOOD: Lipase: 22 U/L (ref 11–51)

## 2019-09-14 LAB — I-STAT BETA HCG BLOOD, ED (MC, WL, AP ONLY): I-stat hCG, quantitative: <5 m[IU]/mL (ref <5)

## 2019-09-14 MED ORDER — ONDANSETRON 4 MG PO TBDP
4.0000 mg | ORAL_TABLET | Freq: Once | ORAL | Status: AC | PRN
  Administered 2019-09-14: 4 mg via ORAL
  Filled 2019-09-14: qty 1

## 2019-09-14 NOTE — ED Notes (Signed)
Pt was given a cup in order to provide a urine specimen.

## 2019-09-14 NOTE — ED Triage Notes (Signed)
Generalized body aches since 1700 today. Pt vomited several times.

## 2019-09-15 ENCOUNTER — Emergency Department (HOSPITAL_COMMUNITY)
Admission: EM | Admit: 2019-09-15 | Discharge: 2019-09-15 | Disposition: A | Discharge status: Home / Self Care | Payer: No Typology Code available for payment source | Attending: Emergency Medicine | Admitting: Emergency Medicine

## 2019-10-07 ENCOUNTER — Encounter (HOSPITAL_COMMUNITY): Payer: Self-pay

## 2019-10-07 ENCOUNTER — Other Ambulatory Visit: Payer: Self-pay

## 2019-10-07 ENCOUNTER — Emergency Department (HOSPITAL_COMMUNITY)
Admission: EM | Admit: 2019-10-07 | Discharge: 2019-10-07 | Disposition: A | Discharge status: Home / Self Care | Payer: No Typology Code available for payment source | Attending: Emergency Medicine | Admitting: Emergency Medicine

## 2019-10-07 DIAGNOSIS — K2901 Acute gastritis with bleeding: Secondary | ICD-10-CM | POA: Diagnosis not present

## 2019-10-07 DIAGNOSIS — F17210 Nicotine dependence, cigarettes, uncomplicated: Secondary | ICD-10-CM | POA: Insufficient documentation

## 2019-10-07 DIAGNOSIS — Z79899 Other long term (current) drug therapy: Secondary | ICD-10-CM | POA: Diagnosis not present

## 2019-10-07 DIAGNOSIS — K920 Hematemesis: Secondary | ICD-10-CM | POA: Diagnosis present

## 2019-10-07 LAB — COMPREHENSIVE METABOLIC PANEL
ALT: 14 U/L (ref 0–44)
AST: 12 U/L — ABNORMAL LOW (ref 15–41)
Albumin: 4.6 g/dL (ref 3.5–5.0)
Alkaline Phosphatase: 55 U/L (ref 38–126)
Anion gap: 11 (ref 5–15)
BUN: 12 mg/dL (ref 6–20)
CO2: 26 mmol/L (ref 22–32)
Calcium: 9.3 mg/dL (ref 8.9–10.3)
Chloride: 103 mmol/L (ref 98–111)
Creatinine, Ser: 0.8 mg/dL (ref 0.44–1.00)
GFR calc Af Amer: >60 mL/min (ref >60)
GFR calc non Af Amer: >60 mL/min (ref >60)
Glucose, Bld: 102 mg/dL — ABNORMAL HIGH (ref 70–99)
Potassium: 3.9 mmol/L (ref 3.5–5.1)
Sodium: 140 mmol/L (ref 135–145)
Total Bilirubin: 0.5 mg/dL (ref 0.3–1.2)
Total Protein: 8.2 g/dL — ABNORMAL HIGH (ref 6.5–8.1)

## 2019-10-07 LAB — CBC
HCT: 42.4 % (ref 36.0–46.0)
Hemoglobin: 13.5 g/dL (ref 12.0–15.0)
MCH: 29.1 pg (ref 26.0–34.0)
MCHC: 31.8 g/dL (ref 30.0–36.0)
MCV: 91.4 fL (ref 80.0–100.0)
Platelets: 345 10*3/uL (ref 150–400)
RBC: 4.64 MIL/uL (ref 3.87–5.11)
RDW: 13.3 % (ref 11.5–15.5)
WBC: 12 10*3/uL — ABNORMAL HIGH (ref 4.0–10.5)
nRBC: 0 % (ref 0.0–0.2)

## 2019-10-07 LAB — TYPE AND SCREEN
ABO/RH(D): O POS
Antibody Screen: NEGATIVE

## 2019-10-07 LAB — I-STAT BETA HCG BLOOD, ED (MC, WL, AP ONLY): I-stat hCG, quantitative: <5 m[IU]/mL (ref <5)

## 2019-10-07 MED ORDER — SUCRALFATE 1 G PO TABS
1.0000 g | ORAL_TABLET | Freq: Once | ORAL | Status: AC
  Administered 2019-10-07: 1 g via ORAL
  Filled 2019-10-07: qty 1

## 2019-10-07 MED ORDER — PANTOPRAZOLE SODIUM 20 MG PO TBEC
20.0000 mg | DELAYED_RELEASE_TABLET | Freq: Every day | ORAL | 0 refills | Status: DC
Start: 2019-10-07 — End: 2020-04-27

## 2019-10-07 MED ORDER — PANTOPRAZOLE SODIUM 40 MG PO TBEC
40.0000 mg | DELAYED_RELEASE_TABLET | Freq: Once | ORAL | Status: AC
  Administered 2019-10-07: 14:00:00 40 mg via ORAL
  Filled 2019-10-07: qty 1

## 2019-10-07 MED ORDER — SUCRALFATE 1 G PO TABS
1.0000 g | ORAL_TABLET | Freq: Four times a day (QID) | ORAL | 0 refills | Status: DC
Start: 2019-10-07 — End: 2020-04-27

## 2019-10-07 NOTE — ED Provider Notes (Signed)
[facility] Note   CSN: [id number] Arrival date & time: 10/07/19  1231     History Chief Complaint  Patient presents with  . Hematemesis    Kristina Holt is a 24 y.o. female.  24 year old female who presents with hematemesis x2.  Patient states that she had 5 shots of tequila last night.  Denies any black stools today.  No abdominal pain.  No fever or chills.  Each episode of emesis was less than a half a cup.  Feels better at this time.  No treatment used prior to arrival.  Denies any use of blood thinners        Past Medical History:  Diagnosis Date  . Medical history non-contributory     Patient Active Problem List   Diagnosis Date Noted  . Normal pregnancy 09/04/2016  . S/P cesarean section 09/04/2016  . Pregnant and not yet delivered 08/11/2016  . Fetal heart rate decelerations affecting management of mother 08/10/2016  . UTI (urinary tract infection) 01/25/2016  . Subchorionic hemorrhage in first trimester 01/25/2016    Past Surgical History:  Procedure Laterality Date  . CESAREAN SECTION N/A 09/04/2016   Procedure: CESAREAN SECTION;  Surgeon: Zelphia Cairo, MD;  Location: [facility] BIRTHING SUITES;  Service: Obstetrics;  Laterality: N/A;  . CESAREAN SECTION    . NO PAST SURGERIES       OB History    Gravida  1   Para  1   Term  1   Preterm  0   AB  0   Living  1     SAB  0   TAB  0   Ectopic  0   Multiple      Live Births  1           Family History  Problem Relation Age of Onset  . Cancer Mother   . Parkinson's disease Maternal Grandfather     Social History   Tobacco Use  . Smoking status: Current Every Day Smoker    Packs/day: 0.25    Types: Cigarettes  . Smokeless tobacco: Never Used  Substance Use Topics  . Alcohol use: Yes  . Drug use: Never    Home Medications Prior to Admission medications   Medication Sig Start Date End Date Taking? Authorizing Provider    acetaminophen (TYLENOL) 500 MG tablet Take 1,000 mg by mouth every 6 (six) hours as needed for mild pain or headache.    [provider]  HYDROmorphone (DILAUDID) 2 MG tablet Take 1 tablet (2 mg total) by mouth every 4 (four) hours as needed for severe pain. 09/07/16   Julio Sicks, NP  ibuprofen (ADVIL) 800 MG tablet Take 1 tablet (800 mg total) by mouth every 8 (eight) hours as needed. 11/25/18   Terrilee Files, MD  ibuprofen (ADVIL,MOTRIN) 600 MG tablet Take 1 tablet (600 mg total) by mouth every 6 (six) hours as needed for mild pain. 09/07/16   Julio Sicks, NP  lidocaine (LIDODERM) 5 % Place 1 patch onto the skin daily. Remove & Discard patch within 12 hours or as directed by MD 09/07/16   Julio Sicks, NP  methocarbamol (ROBAXIN) 500 MG tablet Take 1 tablet (500 mg total) by mouth every 8 (eight) hours as needed for muscle spasms. 11/25/18   Terrilee Files, MD  Prenatal Vit-Fe Fumarate-FA (PRENATAL MULTIVITAMIN) TABS tablet Take 1 tablet by mouth daily at 12 noon.    [provider]    Allergies  Amoxicillin, Amoxicillin, Kiwi extract, Kiwi extract, Penicillins, and Penicillins  Review of Systems   Review of Systems  All other systems reviewed and are negative.   Physical Exam Updated Vital Signs BP 125/79 (BP Location: Left Arm)   Pulse 72   Temp 98 F (36.7 C) (Oral)   Resp 16   Ht 1.727 m (5\' 8" )   Wt 102.1 kg   LMP 09/27/2019   SpO2 99%   BMI 34.21 kg/m   Physical Exam Vitals and nursing note reviewed.  Constitutional:      General: She is not in acute distress.    Appearance: Normal appearance. She is well-developed. She is not toxic-appearing.  HENT:     Head: Normocephalic and atraumatic.  Eyes:     General: Lids are normal.     Conjunctiva/sclera: Conjunctivae normal.     Pupils: Pupils are equal, round, and reactive to light.  Neck:     Thyroid: No thyroid mass.     Trachea: No tracheal deviation.  Cardiovascular:     Rate and  Rhythm: Normal rate and regular rhythm.     Heart sounds: Normal heart sounds. No murmur. No gallop.   Pulmonary:     Effort: Pulmonary effort is normal. No respiratory distress.     Breath sounds: Normal breath sounds. No stridor. No decreased breath sounds, wheezing, rhonchi or rales.  Abdominal:     General: Bowel sounds are normal. There is no distension.     Palpations: Abdomen is soft.     Tenderness: There is no abdominal tenderness. There is no rebound.  Musculoskeletal:        General: No tenderness. Normal range of motion.     Cervical back: Normal range of motion and neck supple.  Skin:    General: Skin is warm and dry.     Findings: No abrasion or rash.  Neurological:     Mental Status: She is alert and oriented to person, place, and time.     GCS: GCS eye subscore is 4. GCS verbal subscore is 5. GCS motor subscore is 6.     Cranial Nerves: No cranial nerve deficit.     Sensory: No sensory deficit.  Psychiatric:        Speech: Speech normal.        Behavior: Behavior normal.     ED Results / Procedures / Treatments   Labs (all labs ordered are listed, but only abnormal results are displayed) Labs Reviewed  COMPREHENSIVE METABOLIC PANEL  CBC  POC OCCULT BLOOD, ED  I-STAT BETA HCG BLOOD, ED ([facility], [facility], [facility] ONLY)  TYPE AND SCREEN    EKG None  Radiology No results found.  Procedures Procedures (including critical care time)  Medications Ordered in ED Medications - No data to display  ED Course  I have reviewed the triage vital signs and the nursing notes.  Pertinent labs & imaging results that were available during my care of the patient were reviewed by me and considered in my medical decision making (see chart for details).    MDM Rules/Calculators/A&P                      Patient's labs are within normal limits here.  No signs of anemia.  BUN/creatinine normal.  Suspect alcoholic gastritis.  Will place on Carafate and PPI and given first dose here  before discharge Final Clinical Impression(s) / ED Diagnoses Final diagnoses:  None    Rx / DC  Orders ED Discharge Orders    None       [name], MD 10/07/19 1351

## 2019-10-07 NOTE — ED Triage Notes (Signed)
Patient states she vomited twice today. The first time had a small amount of bright red blood and the second episode was coffee ground. Patient denies any abdominal pain.

## 2020-04-27 ENCOUNTER — Ambulatory Visit
Admission: EM | Admit: 2020-04-27 | Discharge: 2020-04-27 | Disposition: A | Discharge status: Home / Self Care | Payer: BC Managed Care – PPO | Attending: Emergency Medicine | Admitting: Emergency Medicine

## 2020-04-27 ENCOUNTER — Other Ambulatory Visit: Payer: Self-pay

## 2020-04-27 DIAGNOSIS — R112 Nausea with vomiting, unspecified: Secondary | ICD-10-CM | POA: Diagnosis not present

## 2020-04-27 LAB — POCT URINE PREGNANCY: Preg Test, Ur: NEGATIVE

## 2020-04-27 MED ORDER — ONDANSETRON 4 MG PO TBDP: 4.0000 mg | ORAL_TABLET | Freq: Three times a day (TID) | ORAL | 0 refills | Status: DC | PRN

## 2020-04-27 NOTE — ED Provider Notes (Signed)
[name]    CSN: [id number] Arrival date & time: 04/27/20  5427      History   Chief Complaint Chief Complaint  Patient presents with  . Nausea    since this am  . Emesis    x 7 episodes this am    HPI Kristina Holt is a 24 y.o. female presenting today for evaluation of nausea and vomiting.  Symptoms began this morning.  Denies associated abdominal pain.  Denies change in bowel movements/diarrhea.  She denies any fevers chills or body aches.  Denies URI symptoms, does note that her boyfriend recently tested positive for Covid.  Last menstrual cycle 04/19/2020.   HPI  Past Medical History:  Diagnosis Date  . Medical history non-contributory     Patient Active Problem List   Diagnosis Date Noted  . Normal pregnancy 09/04/2016  . S/P cesarean section 09/04/2016  . Pregnant and not yet delivered 08/11/2016  . Fetal heart rate decelerations affecting management of mother 08/10/2016  . UTI (urinary tract infection) 01/25/2016  . Subchorionic hemorrhage in first trimester 01/25/2016    Past Surgical History:  Procedure Laterality Date  . CESAREAN SECTION N/A 09/04/2016   Procedure: CESAREAN SECTION;  Surgeon: Zelphia Cairo, MD;  Location: [facility]IRTHING SUITES;  Service: Obstetrics;  Laterality: N/A;  . CESAREAN SECTION    . NO PAST SURGERIES      OB History    Gravida  1   Para  1   Term  1   Preterm  0   AB  0   Living  1     SAB  0   TAB  0   Ectopic  0   Multiple      Live Births  1            Home Medications    Prior to Admission medications   Medication Sig Start Date End Date Taking? Authorizing Provider  acetaminophen (TYLENOL) 500 MG tablet Take 1,000 mg by mouth every 6 (six) hours as needed for mild pain or headache.   Yes [provider]  ibuprofen (ADVIL) 800 MG tablet Take 1 tablet (800 mg total) by mouth every 8 (eight) hours as needed. 11/25/18  Yes Terrilee Files, MD  ibuprofen (ADVIL,MOTRIN)  600 MG tablet Take 1 tablet (600 mg total) by mouth every 6 (six) hours as needed for mild pain. 09/07/16  Yes Julio Sicks, NP  lidocaine (LIDODERM) 5 % Place 1 patch onto the skin daily. Remove & Discard patch within 12 hours or as directed by MD Patient not taking: Reported on 10/07/2019 09/07/16   Julio Sicks, NP  ondansetron (ZOFRAN ODT) 4 MG disintegrating tablet Take 1-2 tablets (4-8 mg total) by mouth every 8 (eight) hours as needed for nausea or vomiting. 04/27/20   Sarahann Horrell C, PA-C  pantoprazole (PROTONIX) 20 MG tablet Take 1 tablet (20 mg total) by mouth daily. 10/07/19 04/27/20  [name], MD  sucralfate (CARAFATE) 1 g tablet Take 1 tablet (1 g total) by mouth 4 (four) times daily. 10/07/19 04/27/20  [name], MD    Family History Family History  Problem Relation Age of Onset  . Cancer Mother   . Parkinson's disease Maternal Grandfather     Social History Social History   Tobacco Use  . Smoking status: Current Every Day Smoker    Packs/day: 0.25    Types: Cigarettes  . Smokeless tobacco: Never Used  Vaping Use  . Vaping Use: Never  used  Substance Use Topics  . Alcohol use: Yes  . Drug use: Never     Allergies   Amoxicillin, Amoxicillin, Kiwi extract, Kiwi extract, Penicillins, and Penicillins   Review of Systems Review of Systems  Constitutional: Negative for activity change, appetite change, chills, fatigue and fever.  HENT: Negative for congestion, ear pain, rhinorrhea, sinus pressure, sore throat and trouble swallowing.   Eyes: Negative for discharge and redness.  Respiratory: Negative for cough, chest tightness and shortness of breath.   Cardiovascular: Negative for chest pain.  Gastrointestinal: Positive for nausea and vomiting. Negative for abdominal pain and diarrhea.  Musculoskeletal: Negative for myalgias.  Skin: Negative for rash.  Neurological: Negative for dizziness, light-headedness and headaches.     Physical Exam Triage Vital  Signs ED Triage Vitals  Enc Vitals Group     BP 04/27/20 1136 120/79     Pulse Rate 04/27/20 1136 67     Resp 04/27/20 1136 18     Temp 04/27/20 1136 97.8 F (36.6 C)     Temp Source 04/27/20 1136 Oral     SpO2 04/27/20 1136 98 %     Weight --      Height --      Head Circumference --      Peak Flow --      Pain Score 04/27/20 1138 0     Pain Loc --      Pain Edu? --      Excl. in GC? --    No data found.  Updated Vital Signs BP 120/79 (BP Location: Right Arm)   Pulse 67   Temp 97.8 F (36.6 C) (Oral)   Resp 18   LMP 04/19/2020 (Approximate)   SpO2 98%   Visual Acuity Right Eye Distance:   Left Eye Distance:   Bilateral Distance:    Right Eye Near:   Left Eye Near:    Bilateral Near:     Physical Exam Vitals and nursing note reviewed.  Constitutional:      Appearance: She is well-developed.     Comments: No acute distress  HENT:     Head: Normocephalic and atraumatic.     Ears:     Comments: Bilateral ears without tenderness to palpation of external auricle, tragus and mastoid, EAC's without erythema or swelling, TM's with good bony landmarks and cone of light. Non erythematous.     Nose: Nose normal.     Mouth/Throat:     Comments: Oral mucosa pink and moist, no tonsillar enlargement or exudate. Posterior pharynx patent and nonerythematous, no uvula deviation or swelling. Normal phonation.  Eyes:     Conjunctiva/sclera: Conjunctivae normal.  Cardiovascular:     Rate and Rhythm: Normal rate.  Pulmonary:     Effort: Pulmonary effort is normal. No respiratory distress.     Comments: Breathing comfortably at rest, CTABL, no wheezing, rales or other adventitious sounds auscultated Abdominal:     General: There is no distension.     Comments: Soft, nondistended, nontender to light and deep palpation throughout abdomen  Musculoskeletal:        General: Normal range of motion.     Cervical back: Neck supple.  Skin:    General: Skin is warm and dry.    Neurological:     Mental Status: She is alert and oriented to person, place, and time.      UC Treatments / Results  Labs (all labs ordered are listed, but only abnormal results are displayed) Labs  Reviewed  NOVEL CORONAVIRUS, NAA  POCT URINE PREGNANCY    EKG   Radiology No results found.  Procedures Procedures (including critical care time)  Medications Ordered in UC Medications - No data to display  Initial Impression / Assessment and Plan / UC Course  I have reviewed the triage vital signs and the nursing notes.  Pertinent labs & imaging results that were available during my care of the patient were reviewed by me and considered in my medical decision making (see chart for details).     Suspect likely viral gastroenteritis, no abdominal pain or tenderness, does have Covid exposure so will screen for Covid.  Pregnancy test negative.  Recommending symptomatic and supportive care with Zofran.  Push fluids. Discussed strict return precautions. Patient verbalized understanding and is agreeable with plan.   Final Clinical Impressions(s) / UC Diagnoses   Final diagnoses:  Non-intractable vomiting with nausea, unspecified vomiting type     Discharge Instructions     COVID test pending  For nausea: Zofran prescribed. Begin with every 8 hours, than as you are able to hold food down, take it as needed. Start with clear liquids, then move to plain foods like bananas, rice, applesauce, toast, broth, grits, oatmeal. As those food settle okay you may transition to your normal foods. Avoid spicy and greasy foods as much as possible.  Preventing dehydration is key! You need to replace the fluid your body is expelling. Drink plenty of fluids, may use Pedialyte or sports drinks.   Please return if you are experiencing blood in your vomit or stool or experiencing dizziness, lightheadedness, extreme fatigue, increased abdominal pain.     ED Prescriptions    Medication Sig  Dispense Auth. Provider   ondansetron (ZOFRAN ODT) 4 MG disintegrating tablet Take 1-2 tablets (4-8 mg total) by mouth every 8 (eight) hours as needed for nausea or vomiting. 20 tablet Cordera Stineman, [place] C, PA-C     PDMP not reviewed this encounter.   Lew Dawes, PA-C 04/27/20 1255

## 2020-04-27 NOTE — Discharge Instructions (Signed)
COVID test pending  For nausea: Zofran prescribed. Begin with every 8 hours, than as you are able to hold food down, take it as needed. Start with clear liquids, then move to plain foods like bananas, rice, applesauce, toast, broth, grits, oatmeal. As those food settle okay you may transition to your normal foods. Avoid spicy and greasy foods as much as possible.  Preventing dehydration is key! You need to replace the fluid your body is expelling. Drink plenty of fluids, may use Pedialyte or sports drinks.   Please return if you are experiencing blood in your vomit or stool or experiencing dizziness, lightheadedness, extreme fatigue, increased abdominal pain.

## 2020-04-27 NOTE — ED Triage Notes (Signed)
Pt states she has been nauseated and vomiting since this morning. Pt advised she has had unprotected intercourse. Pt is complaining of no other symptoms. Pt is aox4 and ambulatory.

## 2020-04-28 LAB — SARS-COV-2, NAA 2 DAY TAT

## 2020-04-28 LAB — NOVEL CORONAVIRUS, NAA: SARS-CoV-2, NAA: NOT DETECTED

## 2020-07-13 ENCOUNTER — Ambulatory Visit
Admission: EM | Admit: 2020-07-13 | Discharge: 2020-07-13 | Disposition: A | Discharge status: Home / Self Care | Payer: BC Managed Care – PPO | Attending: Student | Admitting: Student

## 2020-07-13 ENCOUNTER — Other Ambulatory Visit: Payer: Self-pay

## 2020-07-13 DIAGNOSIS — Z3202 Encounter for pregnancy test, result negative: Secondary | ICD-10-CM | POA: Diagnosis not present

## 2020-07-13 DIAGNOSIS — J069 Acute upper respiratory infection, unspecified: Secondary | ICD-10-CM | POA: Diagnosis not present

## 2020-07-13 DIAGNOSIS — R112 Nausea with vomiting, unspecified: Secondary | ICD-10-CM

## 2020-07-13 DIAGNOSIS — Z20822 Contact with and (suspected) exposure to covid-19: Secondary | ICD-10-CM | POA: Diagnosis not present

## 2020-07-13 LAB — POCT URINE PREGNANCY: Preg Test, Ur: NEGATIVE

## 2020-07-13 MED ORDER — BENZONATATE 100 MG PO CAPS: 100.0000 mg | ORAL_CAPSULE | Freq: Three times a day (TID) | ORAL | 0 refills | Status: AC

## 2020-07-13 MED ORDER — ONDANSETRON 4 MG PO TBDP
4.0000 mg | ORAL_TABLET | Freq: Once | ORAL | Status: AC
  Administered 2020-07-13: 4 mg via ORAL

## 2020-07-13 MED ORDER — ONDANSETRON 8 MG PO TBDP: 8.0000 mg | ORAL_TABLET | Freq: Three times a day (TID) | ORAL | 0 refills | Status: AC | PRN

## 2020-07-13 MED ORDER — PROMETHAZINE-DM 6.25-15 MG/5ML PO SYRP: 5.0000 mL | ORAL_SOLUTION | Freq: Four times a day (QID) | ORAL | 0 refills | Status: AC | PRN

## 2020-07-13 NOTE — ED Triage Notes (Signed)
Pt states woke up with nausea, vomiting x3, cough, fatigue, and headache today. States feels hot all over.

## 2020-07-13 NOTE — Discharge Instructions (Signed)
-  For nausea and vomiting, start Zofran (ondansetron).  You can dissolve this medication under your tongue or in your mouth. -Tessalon as needed for cough. Take one pill up to 3x daily (every 8 hours) -Promethazine DM cough syrup for congestion/cough. This could make you drowsy, so take at night before bed. -Tylenol and ibuprofen for fevers/chills, bodyaches, headaches. Make sure to take ibuprofen with food.  -We have sent a PCR test for Covid today.  This should come back in 1 to 2 days.

## 2020-07-13 NOTE — ED Provider Notes (Signed)
[name]    CSN: [id number] Arrival date & time: 07/13/20  1251      History   Chief Complaint Chief Complaint  Patient presents with  . Vomiting  . Cough    HPI Kristina Holt is a 25 y.o. female presenting with vomiting and cough. History UTI, pregnancy. Pt states woke up with nausea, vomiting x3, cough, fatigue, and headache today. States feels hot all over. Denies fevers, diarrhea, abdominal pain, shortness of breath, chest pain, congestion, facial pain, teeth pain, sore throat, loss of taste/smell, swollen lymph nodes, ear pain. Denies worst headache of life. Denies current anxiety, denies active anxiety disorder.    HPI  Past Medical History:  Diagnosis Date  . Medical history non-contributory     Patient Active Problem List   Diagnosis Date Noted  . Normal pregnancy 09/04/2016  . S/P cesarean section 09/04/2016  . Pregnant and not yet delivered 08/11/2016  . Fetal heart rate decelerations affecting management of mother 08/10/2016  . UTI (urinary tract infection) 01/25/2016  . Subchorionic hemorrhage in first trimester 01/25/2016    Past Surgical History:  Procedure Laterality Date  . CESAREAN SECTION N/A 09/04/2016   Procedure: CESAREAN SECTION;  Surgeon: Zelphia Cairo, MD;  Location: [facility] BIRTHING SUITES;  Service: Obstetrics;  Laterality: N/A;  . CESAREAN SECTION    . NO PAST SURGERIES      OB History    Gravida  1   Para  1   Term  1   Preterm  0   AB  0   Living  1     SAB  0   IAB  0   Ectopic  0   Multiple      Live Births  1            Home Medications    Prior to Admission medications   Medication Sig Start Date End Date Taking? Authorizing Provider  benzonatate (TESSALON) 100 MG capsule Take 1 capsule (100 mg total) by mouth every 8 (eight) hours. 07/13/20  Yes [name], PA-C  ondansetron (ZOFRAN ODT) 8 MG disintegrating tablet Take 1 tablet (8 mg total) by mouth every 8 (eight) hours as needed for  nausea or vomiting. 07/13/20  Yes [name], PA-C  promethazine-dextromethorphan (PROMETHAZINE-DM) 6.25-15 MG/5ML syrup Take 5 mLs by mouth 4 (four) times daily as needed for cough. 07/13/20  Yes [name], PA-C  pantoprazole (PROTONIX) 20 MG tablet Take 1 tablet (20 mg total) by mouth daily. 10/07/19 04/27/20  [name], MD  sucralfate (CARAFATE) 1 g tablet Take 1 tablet (1 g total) by mouth 4 (four) times daily. 10/07/19 04/27/20  [name], MD    Family History Family History  Problem Relation Age of Onset  . Cancer Mother   . Parkinson's disease Maternal Grandfather     Social History Social History   Tobacco Use  . Smoking status: Current Every Day Smoker    Packs/day: 0.25    Types: Cigarettes  . Smokeless tobacco: Never Used  Vaping Use  . Vaping Use: Never used  Substance Use Topics  . Alcohol use: Yes  . Drug use: Never     Allergies   Amoxicillin, Amoxicillin, Kiwi extract, Kiwi extract, Penicillins, and Penicillins   Review of Systems Review of Systems  Constitutional: Negative for appetite change, chills and fever.  HENT: Negative for congestion, ear pain, rhinorrhea, sinus pressure, sinus pain and sore throat.   Eyes: Negative for redness and visual  disturbance.  Respiratory: Positive for cough. Negative for chest tightness, shortness of breath and wheezing.   Cardiovascular: Negative for chest pain and palpitations.  Gastrointestinal: Positive for nausea and vomiting. Negative for abdominal pain, constipation and diarrhea.  Genitourinary: Negative for dysuria, frequency and urgency.  Musculoskeletal: Negative for myalgias.  Neurological: Negative for dizziness, weakness and headaches.  Psychiatric/Behavioral: Negative for confusion.  All other systems reviewed and are negative.    Physical Exam Triage Vital Signs ED Triage Vitals  Enc Vitals Group     BP 07/13/20 1301 138/89     Pulse Rate 07/13/20 1301 81     Resp 07/13/20 1301 20      Temp 07/13/20 1301 98.3 F (36.8 C)     Temp Source 07/13/20 1301 Oral     SpO2 07/13/20 1301 98 %     Weight --      Height --      Head Circumference --      Peak Flow --      Pain Score 07/13/20 1311 0     Pain Loc --      Pain Edu? --      Excl. in GC? --    No data found.  Updated Vital Signs BP 138/89 (BP Location: Left Arm)   Pulse 81   Temp 98.3 F (36.8 C) (Oral)   Resp 20   LMP 06/28/2020   SpO2 98%   Breastfeeding No   Visual Acuity Right Eye Distance:   Left Eye Distance:   Bilateral Distance:    Right Eye Near:   Left Eye Near:    Bilateral Near:     Physical Exam Vitals reviewed.  Constitutional:      General: She is not in acute distress.    Appearance: Normal appearance. She is ill-appearing.  HENT:     Head: Normocephalic and atraumatic.     Right Ear: Hearing, tympanic membrane, ear canal and external ear normal. No swelling or tenderness. There is no impacted cerumen. No mastoid tenderness. Tympanic membrane is not perforated, erythematous, retracted or bulging.     Left Ear: Hearing, tympanic membrane, ear canal and external ear normal. No swelling or tenderness. There is no impacted cerumen. No mastoid tenderness. Tympanic membrane is not perforated, erythematous, retracted or bulging.     Nose:     Right Sinus: No maxillary sinus tenderness or frontal sinus tenderness.     Left Sinus: No maxillary sinus tenderness or frontal sinus tenderness.     Mouth/Throat:     Mouth: Mucous membranes are moist.     Pharynx: Uvula midline. No oropharyngeal exudate or posterior oropharyngeal erythema.     Tonsils: No tonsillar exudate.  Cardiovascular:     Rate and Rhythm: Normal rate and regular rhythm.     Heart sounds: Normal heart sounds.  Pulmonary:     Breath sounds: Normal breath sounds and air entry. No wheezing, rhonchi or rales.  Chest:     Chest wall: No tenderness.  Abdominal:     General: Abdomen is flat. Bowel sounds are normal.      Tenderness: There is generalized abdominal tenderness. There is no right CVA tenderness, left CVA tenderness, guarding or rebound. Negative signs include Murphy's sign, Rovsing's sign and McBurney's sign.  Lymphadenopathy:     Cervical: No cervical adenopathy.  Neurological:     General: No focal deficit present.     Mental Status: She is alert and oriented to person, place, and time.  Psychiatric:        Attention and Perception: Attention and perception normal.        Mood and Affect: Mood and affect normal.        Behavior: Behavior normal. Behavior is cooperative.        Thought Content: Thought content normal.        Judgment: Judgment normal.      UC Treatments / Results  Labs (all labs ordered are listed, but only abnormal results are displayed) Labs Reviewed  NOVEL CORONAVIRUS, NAA  POCT URINE PREGNANCY    EKG   Radiology No results found.  Procedures Procedures (including critical care time)  Medications Ordered in UC Medications  ondansetron (ZOFRAN-ODT) disintegrating tablet 4 mg (4 mg Oral Given 07/13/20 1317)    Initial Impression / Assessment and Plan / UC Course  I have reviewed the triage vital signs and the nursing notes.  Pertinent labs & imaging results that were available during my care of the patient were reviewed by me and considered in my medical decision making (see chart for details).     This patient is a 25 year old female presenting with cough and nausea/vomiting.  Today she is afebrile, nontachycardic, oxygenating well on room air.  Zofran administered during this visit with improvement in nausea.  Urine pregnancy negative.  Covid sent. Isolation as per CDC guidelines.   Zofran ODT sent for nausea/vomiting. Rec good hydration, BRAT diet as tolerated. Return precautions- inability to hydrate by mouth, new/worsenign abdominal pain, worsening of nausea despite treatment, etc.   For cough, tessalon and promethazine as below.   Return  precautions- chest pain, shortness of breath, new/worsening fevers/chills, confusion, worsening of symptoms despite the above treatment plan, etc.   Spent over 40 minutes obtaining H&P, performing physical, discussing results, treatment plan and plan for follow-up with patient. Patient agrees with plan.   This chart was dictated using voice recognition software, Dragon. Despite the best efforts of this provider to proofread and correct errors, errors may still occur which can change documentation meaning.  Final Clinical Impressions(s) / UC Diagnoses   Final diagnoses:  Encounter for screening laboratory testing for COVID-19 virus  Negative pregnancy test  Non-intractable vomiting with nausea, unspecified vomiting type  Viral URI with cough     Discharge Instructions     -For nausea and vomiting, start Zofran (ondansetron).  You can dissolve this medication under your tongue or in your mouth. -Tessalon as needed for cough. Take one pill up to 3x daily (every 8 hours) -Promethazine DM cough syrup for congestion/cough. This could make you drowsy, so take at night before bed. -Tylenol and ibuprofen for fevers/chills, bodyaches, headaches. Make sure to take ibuprofen with food.  -We have sent a PCR test for Covid today.  This should come back in 1 to 2 days.     ED Prescriptions    Medication Sig Dispense Auth. Provider   ondansetron (ZOFRAN ODT) 8 MG disintegrating tablet Take 1 tablet (8 mg total) by mouth every 8 (eight) hours as needed for nausea or vomiting. 20 tablet [name], PA-C   promethazine-dextromethorphan (PROMETHAZINE-DM) 6.25-15 MG/5ML syrup Take 5 mLs by mouth 4 (four) times daily as needed for cough. 118 mL [name], PA-C   benzonatate (TESSALON) 100 MG capsule Take 1 capsule (100 mg total) by mouth every 8 (eight) hours. 21 capsule [name], PA-C     PDMP not reviewed this encounter.   [name], PA-C 07/13/20 1418

## 2020-07-14 LAB — NOVEL CORONAVIRUS, NAA: SARS-CoV-2, NAA: NOT DETECTED

## 2020-07-14 LAB — SARS-COV-2, NAA 2 DAY TAT

## 2020-09-18 ENCOUNTER — Ambulatory Visit
Admission: RE | Admit: 2020-09-18 | Discharge: 2020-09-18 | Disposition: A | Discharge status: Home / Self Care | Payer: Self-pay | Source: Ambulatory Visit | Attending: Emergency Medicine | Admitting: Emergency Medicine

## 2020-09-18 ENCOUNTER — Ambulatory Visit (INDEPENDENT_AMBULATORY_CARE_PROVIDER_SITE_OTHER): Payer: Self-pay

## 2020-09-18 ENCOUNTER — Other Ambulatory Visit: Payer: Self-pay

## 2020-09-18 VITALS — BP 118/76 | HR 73 | Temp 98.5°F | Resp 20

## 2020-09-18 DIAGNOSIS — S8990XA Unspecified injury of unspecified lower leg, initial encounter: Secondary | ICD-10-CM

## 2020-09-18 DIAGNOSIS — W19XXXA Unspecified fall, initial encounter: Secondary | ICD-10-CM

## 2020-09-18 DIAGNOSIS — M25562 Pain in left knee: Secondary | ICD-10-CM

## 2020-09-18 MED ORDER — NAPROXEN 500 MG PO TABS: 500.0000 mg | ORAL_TABLET | Freq: Two times a day (BID) | ORAL | 0 refills | Status: AC
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
## 2020-09-18 NOTE — Discharge Instructions (Signed)
Your xray is overall well appearing. You do have some baseline arthritic changes which are visible; there is some fluid which is consistent with a sprain as well.  Ice, elevation, compression with ace wrap or sleeve.  See exercises provided.  Activity as tolerated.  Follow up with sports medicine or orthopedics as needed if persistent pain or no improvement in the next month.
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
## 2021-03-13 ENCOUNTER — Emergency Department (HOSPITAL_COMMUNITY)
Admission: EM | Admit: 2021-03-13 | Discharge: 2021-03-13 | Disposition: A | Discharge status: Home / Self Care | Payer: Self-pay | Attending: Emergency Medicine | Admitting: Emergency Medicine

## 2021-03-13 ENCOUNTER — Other Ambulatory Visit: Payer: Self-pay

## 2021-03-13 ENCOUNTER — Encounter (HOSPITAL_COMMUNITY): Payer: Self-pay

## 2021-03-13 DIAGNOSIS — R42 Dizziness and giddiness: Secondary | ICD-10-CM | POA: Insufficient documentation

## 2021-03-13 DIAGNOSIS — M79602 Pain in left arm: Secondary | ICD-10-CM | POA: Insufficient documentation

## 2021-03-13 DIAGNOSIS — Z5321 Procedure and treatment not carried out due to patient leaving prior to being seen by health care provider: Secondary | ICD-10-CM | POA: Insufficient documentation

## 2021-03-13 DIAGNOSIS — R002 Palpitations: Secondary | ICD-10-CM | POA: Insufficient documentation

## 2021-03-13 LAB — COMPREHENSIVE METABOLIC PANEL
ALT: 16 U/L (ref 0–44)
AST: 13 U/L — ABNORMAL LOW (ref 15–41)
Albumin: 4.2 g/dL (ref 3.5–5.0)
Alkaline Phosphatase: 57 U/L (ref 38–126)
Anion gap: 6 (ref 5–15)
BUN: 14 mg/dL (ref 6–20)
CO2: 25 mmol/L (ref 22–32)
Calcium: 9 mg/dL (ref 8.9–10.3)
Chloride: 107 mmol/L (ref 98–111)
Creatinine, Ser: 0.92 mg/dL (ref 0.44–1.00)
GFR, Estimated: >60 mL/min (ref >60)
Glucose, Bld: 111 mg/dL — ABNORMAL HIGH (ref 70–99)
Potassium: 3.4 mmol/L — ABNORMAL LOW (ref 3.5–5.1)
Sodium: 138 mmol/L (ref 135–145)
Total Bilirubin: 0.6 mg/dL (ref 0.3–1.2)
Total Protein: 7.5 g/dL (ref 6.5–8.1)

## 2021-03-13 LAB — CBC
HCT: 36.4 % (ref 36.0–46.0)
Hemoglobin: 11.7 g/dL — ABNORMAL LOW (ref 12.0–15.0)
MCH: 28.8 pg (ref 26.0–34.0)
MCHC: 32.1 g/dL (ref 30.0–36.0)
MCV: 89.7 fL (ref 80.0–100.0)
Platelets: 290 10*3/uL (ref 150–400)
RBC: 4.06 MIL/uL (ref 3.87–5.11)
RDW: 13.1 % (ref 11.5–15.5)
WBC: 11.1 10*3/uL — ABNORMAL HIGH (ref 4.0–10.5)
nRBC: 0 % (ref 0.0–0.2)

## 2021-03-13 LAB — POC URINE PREG, ED: Preg Test, Ur: NEGATIVE

## 2021-03-13 NOTE — ED Triage Notes (Signed)
Patient said she tried to sleep but her heart stated beating fast. She got light headed and pains in her left arm. This has never happened before.

## 2022-11-07 IMAGING — DX DG KNEE COMPLETE 4+V*L*
4 series · 4 of 4 positions shown · non-contrast
Comparison: None.

CLINICAL DATA: Left knee pain after fall

EXAM:
LEFT KNEE - COMPLETE 4+ VIEW

[knee ap]
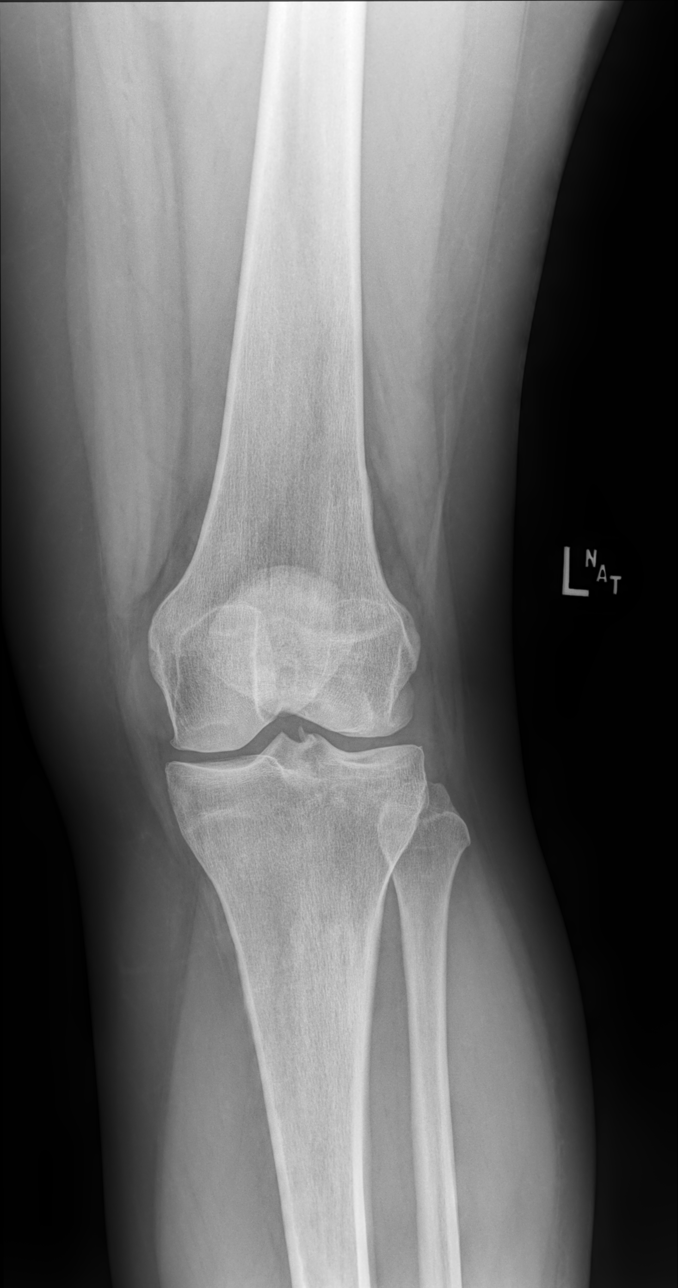

[knee lmo]
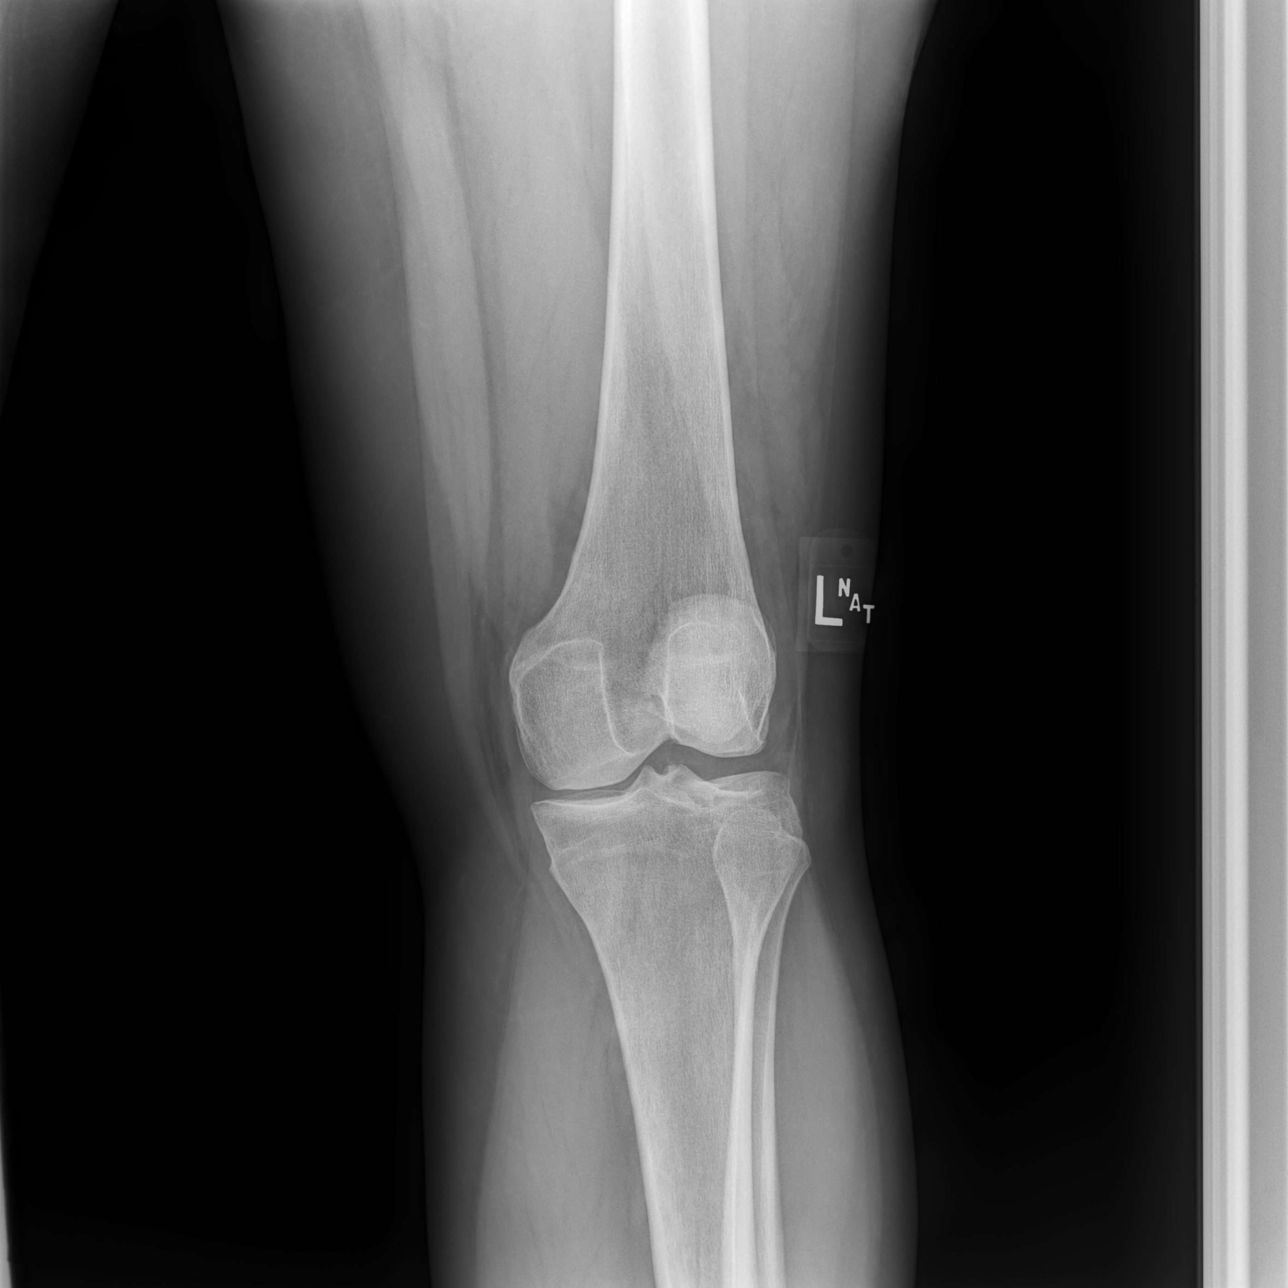

[knee mlo]
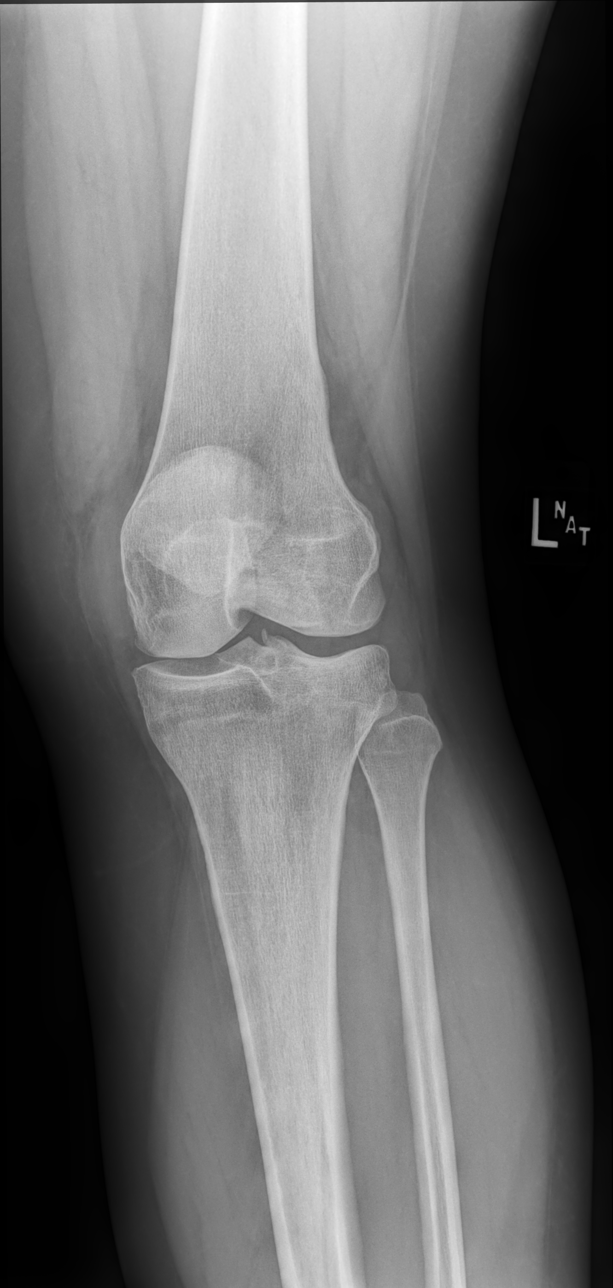

[knee lat]
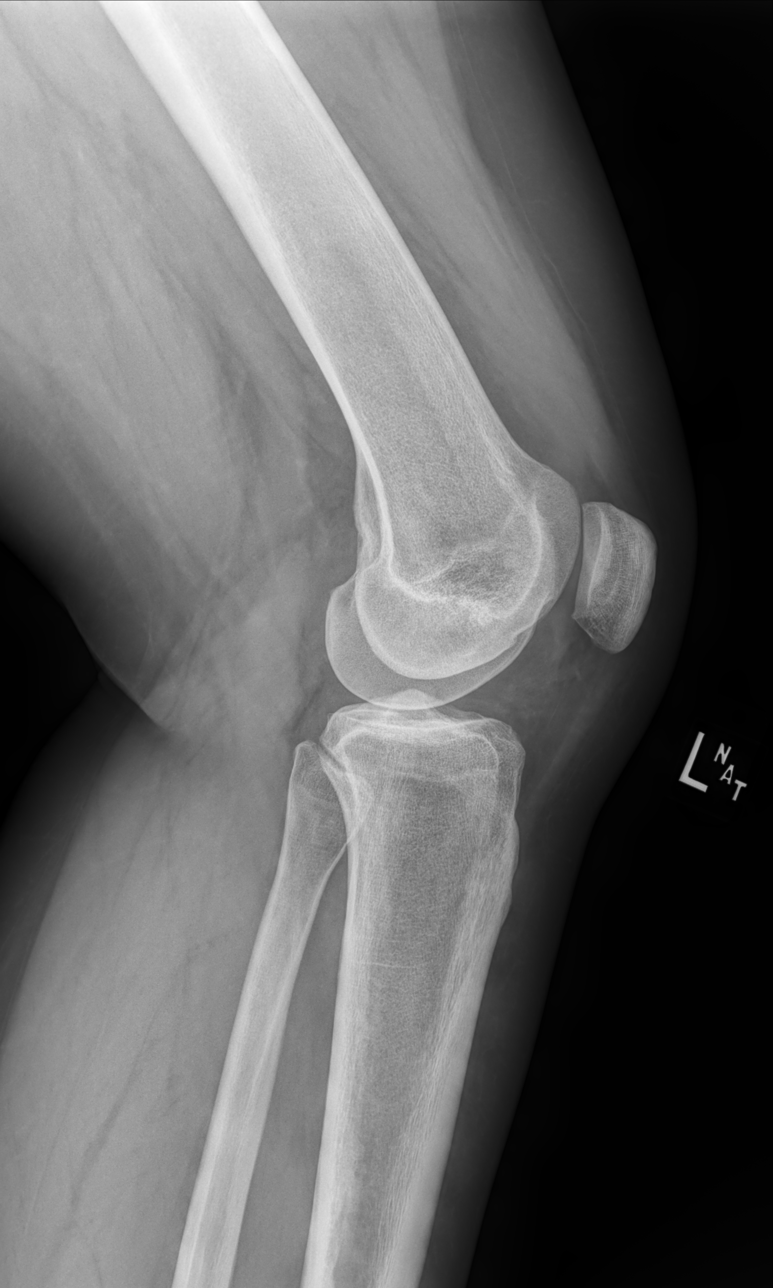

[4 of 4 positions shown; findings below may reference images not displayed]

FINDINGS: No acute fracture or dislocation. Mild medial compartment joint
space narrowing and tiny marginal osteophyte formation. Small joint
effusion. Soft tissues within normal limits.
IMPRESSION: 1. Small joint effusion without acute fracture or dislocation.
2. Mild medial compartment degenerative changes, which is advanced
for age.
